# Patient Record
Sex: Male | Born: 1970
Health system: Southern US, Community
[De-identification: ages and names within clinical notes are randomized; demographics above are authoritative.]

## PROBLEM LIST (undated history)

## (undated) DIAGNOSIS — B9562 Methicillin resistant Staphylococcus aureus infection as the cause of diseases classified elsewhere: Secondary | ICD-10-CM

## (undated) DIAGNOSIS — Z87442 Personal history of urinary calculi: Secondary | ICD-10-CM

## (undated) DIAGNOSIS — L723 Sebaceous cyst: Principal | ICD-10-CM

## (undated) DIAGNOSIS — L0390 Cellulitis, unspecified: Secondary | ICD-10-CM

## (undated) DIAGNOSIS — K429 Umbilical hernia without obstruction or gangrene: Secondary | ICD-10-CM

## (undated) DIAGNOSIS — M1990 Unspecified osteoarthritis, unspecified site: Secondary | ICD-10-CM

## (undated) HISTORY — DX: Sebaceous cyst: L72.3

## (undated) HISTORY — DX: Methicillin resistant Staphylococcus aureus infection as the cause of diseases classified elsewhere: B95.62

## (undated) HISTORY — DX: Cellulitis, unspecified: L03.90

## (undated) HISTORY — PX: INCISION AND DRAINAGE ABSCESS / HEMATOMA OF BURSA / KNEE / THIGH: SUR668

## (undated) HISTORY — DX: Umbilical hernia without obstruction or gangrene: K42.9

---
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
## 1998-10-31 ENCOUNTER — Emergency Department (HOSPITAL_COMMUNITY): Admission: EM | Admit: 1998-10-31 | Discharge: 1998-10-31 | Payer: Self-pay

## 2002-11-29 ENCOUNTER — Emergency Department (HOSPITAL_COMMUNITY): Admission: EM | Admit: 2002-11-29 | Discharge: 2002-11-29 | Payer: Self-pay | Admitting: Emergency Medicine

## 2002-12-03 ENCOUNTER — Ambulatory Visit (HOSPITAL_COMMUNITY): Admission: RE | Admit: 2002-12-03 | Discharge: 2002-12-03 | Payer: Self-pay

## 2003-06-01 ENCOUNTER — Encounter: Admission: RE | Admit: 2003-06-01 | Discharge: 2003-06-01 | Payer: Self-pay | Admitting: Internal Medicine

## 2011-04-28 ENCOUNTER — Emergency Department (HOSPITAL_COMMUNITY): Payer: BC Managed Care – PPO

## 2011-04-28 ENCOUNTER — Emergency Department (HOSPITAL_COMMUNITY)
Admission: EM | Admit: 2011-04-28 | Discharge: 2011-04-29 | Disposition: A | Discharge status: Home / Self Care | Payer: BC Managed Care – PPO | Attending: Emergency Medicine | Admitting: Emergency Medicine

## 2011-04-28 DIAGNOSIS — M25569 Pain in unspecified knee: Secondary | ICD-10-CM | POA: Insufficient documentation

## 2011-04-28 DIAGNOSIS — M76899 Other specified enthesopathies of unspecified lower limb, excluding foot: Secondary | ICD-10-CM | POA: Insufficient documentation

## 2011-04-28 DIAGNOSIS — M25469 Effusion, unspecified knee: Secondary | ICD-10-CM | POA: Insufficient documentation

## 2011-04-28 DIAGNOSIS — L02419 Cutaneous abscess of limb, unspecified: Secondary | ICD-10-CM | POA: Insufficient documentation

## 2013-05-08 ENCOUNTER — Ambulatory Visit (INDEPENDENT_AMBULATORY_CARE_PROVIDER_SITE_OTHER): Payer: BC Managed Care – PPO | Admitting: Surgery

## 2013-05-12 ENCOUNTER — Encounter (INDEPENDENT_AMBULATORY_CARE_PROVIDER_SITE_OTHER): Payer: Self-pay | Admitting: Surgery

## 2013-05-12 ENCOUNTER — Encounter (INDEPENDENT_AMBULATORY_CARE_PROVIDER_SITE_OTHER): Payer: Self-pay

## 2013-05-12 ENCOUNTER — Ambulatory Visit (INDEPENDENT_AMBULATORY_CARE_PROVIDER_SITE_OTHER): Payer: BC Managed Care – PPO | Admitting: Surgery

## 2013-05-12 VITALS — BP 118/78 | HR 66 | Temp 97.6°F | Resp 14 | Ht 73.0 in | Wt 261.4 lb

## 2013-05-12 DIAGNOSIS — D1739 Benign lipomatous neoplasm of skin and subcutaneous tissue of other sites: Secondary | ICD-10-CM

## 2013-05-12 DIAGNOSIS — K429 Umbilical hernia without obstruction or gangrene: Secondary | ICD-10-CM | POA: Insufficient documentation

## 2013-05-12 DIAGNOSIS — L723 Sebaceous cyst: Secondary | ICD-10-CM | POA: Insufficient documentation

## 2013-05-12 HISTORY — DX: Sebaceous cyst: L72.3

## 2013-05-12 HISTORY — DX: Umbilical hernia without obstruction or gangrene: K42.9

## 2013-05-12 MED ORDER — SULFAMETHOXAZOLE-TMP DS 800-160 MG PO TABS: 1.0000 | ORAL_TABLET | Freq: Two times a day (BID) | ORAL | Status: DC

## 2013-05-12 NOTE — Progress Notes (Addendum)
Subjective:     Patient ID: Timothy Newman, male   DOB: 09/15/1970, 42 y.o.   MRN: [id number]  HPI  Timothy Newman  06-Mar-1971 [id number]  Patient Care Team: Pearson Grippe, MD as PCP - General (Internal Medicine)  This patient is a 42 y.o.male who presents today for surgical evaluation at the request of Dr. Selena Batten .   Reason for visit: Cyst on back  Was an active male.  Otherwise healthy.  Has not smoked in decades.  Has noticed a painful lump on his back.  It is intermittently swollen and inflamed.  This all started about 6 months ago.  His primary care physician has been following up.  He has been given oral antibiotics to help calm down.  Because of repeated episodes of swelling and pain, he wished to have it removed.  Therefore, he was sent to surgery for evaluation.  He recalls an episode about 10 years ago where he had abscesses drained on his inner and outer right thigh.  He was on IV vancomycin with a PICC line.  Treated in the ER for this.  Recalls being told he had MRSA.  No other skin infections.    Patient Active Problem List   Diagnosis Date Noted  . Inflamed sebaceous cyst 05/12/2013    Past Medical History  Diagnosis Date  . MRSA cellulitis ?2004    I&D in ED.  IV Vanco w PICC    Past Surgical History  Procedure Laterality Date  . Incision and drainage abscess / hematoma of bursa / knee / thigh  ?2004    ?Dr Lucas Mallow     History   Social History  . Marital Status: Married    Spouse Name: N/A    Number of Children: N/A  . Years of Education: N/A   Occupational History  . Not on file.   Social History Main Topics  . Smoking status: Former Games developer  . Smokeless tobacco: Former Neurosurgeon  . Alcohol Use: No  . Drug Use: No  . Sexual Activity: Not on file   Other Topics Concern  . Not on file   Social History Narrative  . No narrative on file    History reviewed. No pertinent family history.  No current outpatient prescriptions on file.   No current  facility-administered medications for this visit.     No Known Allergies  BP 118/78  Pulse 66  Temp(Src) 97.6 F (36.4 C) (Temporal)  Resp 14  Ht 6\' 1"  (1.854 m)  Wt 261 lb 6.4 oz (118.57 kg)  BMI 34.49 kg/m2  No results found.   Review of Systems  Constitutional: Negative for fever, chills and diaphoresis.  HENT: Negative for ear discharge, facial swelling, mouth sores, nosebleeds, sore throat and trouble swallowing.   Eyes: Negative for photophobia, discharge and visual disturbance.  Respiratory: Negative for choking, chest tightness, shortness of breath and stridor.   Cardiovascular: Negative for chest pain and palpitations.  Gastrointestinal: Negative for nausea, vomiting, abdominal pain, diarrhea, constipation, blood in stool, abdominal distention, anal bleeding and rectal pain.  Endocrine: Negative for cold intolerance and heat intolerance.  Genitourinary: Negative for dysuria, urgency, difficulty urinating and testicular pain.  Musculoskeletal: Positive for back pain. Negative for arthralgias, gait problem and myalgias.  Skin: Positive for rash. Negative for color change, pallor and wound.  Allergic/Immunologic: Negative for environmental allergies and food allergies.  Neurological: Negative for dizziness, speech difficulty, weakness, numbness and headaches.  Hematological: Negative for adenopathy. Does not bruise/bleed  easily.  Psychiatric/Behavioral: Negative for hallucinations, confusion and agitation.       Objective:   Physical Exam  Constitutional: He is oriented to person, place, and time. He appears well-developed and well-nourished. No distress.  HENT:  Head: Normocephalic.  Mouth/Throat: Oropharynx is clear and moist. No oropharyngeal exudate.  Eyes: Conjunctivae and EOM are normal. Pupils are equal, round, and reactive to light. No scleral icterus.  Neck: Normal range of motion. Neck supple. No tracheal deviation present.  Cardiovascular: Normal rate,  regular rhythm and intact distal pulses.   Pulmonary/Chest: Effort normal and breath sounds normal. No respiratory distress.  Abdominal: Soft. He exhibits no distension. There is no tenderness. A hernia is present. Hernia confirmed negative in the right inguinal area and confirmed negative in the left inguinal area.    Musculoskeletal: Normal range of motion. He exhibits no tenderness.       Back:  Lymphadenopathy:    He has no cervical adenopathy.       Right: No inguinal adenopathy present.       Left: No inguinal adenopathy present.  Neurological: He is alert and oriented to person, place, and time. No cranial nerve deficit. He exhibits normal muscle tone. Coordination normal.  Skin: Skin is warm and dry. No rash noted. He is not diaphoretic. No erythema. No pallor.  Psychiatric: He has a normal mood and affect. His behavior is normal. Judgment and thought content normal.       Assessment:     Inflamed sebaceous cyst with recurrent infections.     Plan:     I recommended removal.  It is not seem actively infected now.  We had time right now, so we took care of it today:  The pathophysiology of subcutaneous masses and differential diagnosis was discussed.  Natural history progression was discussed.  The patient's symptoms are not adequately controlled.  The mass has not resolved.  Non-operative treatment has not healed the mass.  Therefore, I recommended excision of the mass. Technique, risks, benefits, alternatives discussed.  The patient expressed understanding & wished to proceed.  I placed a field block with local anaesthetic.  I incised the skin 2 cm transversely over the mass.  I sharply entered through the dermal layer into the subcutaneous tissues with a scalpel.  I was able to circumferentially come around the 2x2cm cystic mass and remove it.  Because the mass was obviously benign cyst lipoma, I did not send the mass for pathological evaluation. I assured hemostasis.    Because there is no obvious infection, I decided to close the wound.  I closed it with deep dermal interrupted absorbable 3-0 vicryl suture.  I closed skin with running subcuticular suture.  Steri-Strips are placed.  Sterile gauze and dressing applied.  The patient tolerated the procedure.   I discussed postop care with the patient in detail.  Instructions are written and given to them on discharge.  I gave a prescription of Bactrim and hold using it unless things seem to be worsening.  If he was still struggling, call us sooner.  We will have the patient return to clinic for close follow up to make sure the incision heals.  His wife works at the dialysis center, so he is confident that he is somewhat that will keep an eye on it with him.  He has a small umbilical hernia, but it is not symptomatic.  He has never noticed anything there.  I do not strongly recommend repairing it at this time  unless something changes

## 2013-05-12 NOTE — Patient Instructions (Addendum)
GENERAL SURGERY: POST OP INSTRUCTIONS  1. DIET: Follow a light bland diet the first 24 hours after arrival home, such as soup, liquids, crackers, etc.  Be sure to include lots of fluids daily.  Avoid fast food or heavy meals as your are more likely to get nauseated.   2. Take your usually prescribed home medications unless otherwise directed. 3. PAIN CONTROL: a. Pain is best controlled by a usual combination of three different methods TOGETHER: i. Ice/Heat ii. Over the counter pain medication iii. Prescription pain medication b. Most patients will experience some swelling and bruising around the incisions.  Ice packs or heating pads (30-60 minutes up to 6 times a day) will help. Use ice for the first few days to help decrease swelling and bruising, then switch to heat to help relax tight/sore spots and speed recovery.  Some people prefer to use ice alone, heat alone, alternating between ice & heat.  Experiment to what works for you.  Swelling and bruising can take several weeks to resolve.   c. It is helpful to take an over-the-counter pain medication regularly for the first few weeks.  Choose one of the following that works best for you: i. Naproxen (Aleve, etc)  Two 220mg  tabs twice a day ii. Ibuprofen (Advil, etc) Three 200mg  tabs four times a day (every meal & bedtime) iii. Acetaminophen (Tylenol, etc) 500-650mg  four times a day (every meal & bedtime)  4. Avoid getting constipated.  Between the surgery and the pain medications, it is common to experience some constipation.  Increasing fluid intake and taking a fiber supplement (such as Metamucil, Citrucel, FiberCon, MiraLax, etc) 1-2 times a day regularly will usually help prevent this problem from occurring.  A mild laxative (prune juice, Milk of Magnesia, MiraLax, etc) should be taken according to package directions if there are no bowel movements after 48 hours.   5. Wash / shower every day.  You may shower over the dressings as they are  waterproof.  Continue to shower over incision(s) after the dressing is off. 6. Remove your bandage day after surgery.  You may leave the incision open to air.  You have skin tapes (Steri Strips) covering the incision(s).  Leave them on until one week, then remove.  You may replace a dressing/Band-Aid to cover the incision for comfort if you wish. If the incision is getting more inflamed, start prescription antibiotic Bactrim (sulfa/TMP) & call us if not better     7. ACTIVITIES as tolerated:   a. You may resume regular (light) daily activities beginning the next day-such as daily self-care, walking, climbing stairs-gradually increasing activities as tolerated.  If you can walk 30 minutes without difficulty, it is safe to try more intense activity such as jogging, treadmill, bicycling, low-impact aerobics, swimming, etc. b. Save the most intensive and strenuous activity for last such as sit-ups, heavy lifting, contact sports, etc  Refrain from any heavy lifting or straining until you are off narcotics for pain control.   c. DO NOT PUSH THROUGH PAIN.  Let pain be your guide: If it hurts to do something, don't do it.  Pain is your body warning you to avoid that activity for another week until the pain goes down. d. You may drive when you are no longer taking prescription pain medication, you can comfortably wear a seatbelt, and you can safely maneuver your car and apply brakes. e. Bonita Quin may have sexual intercourse when it is comfortable.  8. FOLLOW UP in our office a.  Please call [facility] at [phone] to set up an appointment to see your surgeon in the office for a follow-up appointment approximately 2-3 weeks after your surgery. b. Make sure that you call for this appointment the day you arrive home to insure a convenient appointment time. 9. IF YOU HAVE DISABILITY OR FAMILY LEAVE FORMS, BRING THEM TO THE OFFICE FOR PROCESSING.  DO NOT GIVE THEM TO YOUR DOCTOR.   WHEN TO CALL us [phone]  [phone]: 1. Poor pain control 2. Reactions / problems with new medications (rash/itching, nausea, etc)  3. Fever over 101.5 F (38.5 C) 4. Worsening swelling or bruising 5. Continued bleeding from incision. 6. Increased pain, redness, or drainage from the incision 7. Difficulty breathing / swallowing   The clinic staff is available to answer your questions during regular business hours (8:30am-5pm).  Please don't hesitate to call and ask to speak to one of our nurses for clinical concerns.   If you have a medical emergency, go to the nearest emergency room or call 911.  A surgeon from [facility] is always on call at the [facility], [place] [address], [place], [place]  [address] ? MAIN: [phone] ? TOLL FREE: [phone] ?  FAX [phone] [email or web address]

## 2013-05-14 ENCOUNTER — Encounter (INDEPENDENT_AMBULATORY_CARE_PROVIDER_SITE_OTHER): Payer: Self-pay

## 2013-06-09 ENCOUNTER — Ambulatory Visit (INDEPENDENT_AMBULATORY_CARE_PROVIDER_SITE_OTHER): Payer: BC Managed Care – PPO | Admitting: Surgery

## 2013-06-09 ENCOUNTER — Encounter (INDEPENDENT_AMBULATORY_CARE_PROVIDER_SITE_OTHER): Payer: Self-pay | Admitting: Surgery

## 2013-06-09 VITALS — BP 138/88 | HR 80 | Temp 98.2°F | Resp 14 | Ht 73.0 in | Wt 261.0 lb

## 2013-06-09 DIAGNOSIS — L723 Sebaceous cyst: Secondary | ICD-10-CM

## 2013-06-09 DIAGNOSIS — K429 Umbilical hernia without obstruction or gangrene: Secondary | ICD-10-CM

## 2013-06-09 NOTE — Patient Instructions (Signed)

## 2013-06-09 NOTE — Progress Notes (Signed)
Subjective:     Patient ID: Timothy Newman, male   DOB: [birth year], 42 y.o.   MRN: [id number]  HPI  Timothy Newman  08-Jul-1970 [id number]  Patient Care Team: Pearson Grippe, MD as PCP - General (Internal Medicine)  Procedure (Date: 05/12/2013):  Removal of cyst on the upper back.  This patient returns for surgical re-evaluation.  He feels well.  Her incision healed well.  No pain or drainage.  No fevers or chills.  No lesions.  No problems in his belly button.  Patient Active Problem List   Diagnosis Date Noted  . Umbilical hernia - 5mm 05/12/2013    Past Medical History  Diagnosis Date  . MRSA cellulitis ?2004    I&D in ED.  IV Vanco w PICC  . Umbilical hernia - 5mm 05/12/2013  . Inflamed sebaceous cyst s/p excision Nov 2014 05/12/2013    Past Surgical History  Procedure Laterality Date  . Incision and drainage abscess / hematoma of bursa / knee / thigh  ?2004    ?Dr Lucas Mallow     History   Social History  . Marital Status: Married    Spouse Name: N/A    Number of Children: N/A  . Years of Education: N/A   Occupational History  . Not on file.   Social History Main Topics  . Smoking status: Former Games developer  . Smokeless tobacco: Former Neurosurgeon  . Alcohol Use: No  . Drug Use: No  . Sexual Activity: Not on file   Other Topics Concern  . Not on file   Social History Narrative  . No narrative on file    History reviewed. No pertinent family history.  No current outpatient prescriptions on file.   No current facility-administered medications for this visit.     No Known Allergies  BP 138/88  Pulse 80  Temp(Src) 98.2 F (36.8 C) (Temporal)  Resp 14  Ht 6\' 1"  (1.854 m)  Wt 261 lb (118.389 kg)  BMI 34.44 kg/m2  No results found.   Review of Systems  Constitutional: Negative for fever, chills and diaphoresis.  HENT: Negative for sore throat and trouble swallowing.   Eyes: Negative for photophobia and visual disturbance.  Respiratory: Negative for choking  and shortness of breath.   Cardiovascular: Negative for chest pain and palpitations.  Gastrointestinal: Negative for nausea, vomiting, abdominal distention, anal bleeding and rectal pain.  Genitourinary: Negative for dysuria, urgency, difficulty urinating and testicular pain.  Musculoskeletal: Negative for arthralgias, gait problem, myalgias and neck pain.  Skin: Negative for color change and rash.  Neurological: Negative for dizziness, speech difficulty, weakness and numbness.  Hematological: Negative for adenopathy.  Psychiatric/Behavioral: Negative for hallucinations, confusion and agitation.       Objective:   Physical Exam  Constitutional: He is oriented to person, place, and time. He appears well-developed and well-nourished. No distress.  HENT:  Head: Normocephalic.  Mouth/Throat: Oropharynx is clear and moist. No oropharyngeal exudate.  Eyes: Conjunctivae and EOM are normal. Pupils are equal, round, and reactive to light. No scleral icterus.  Neck: Normal range of motion. No tracheal deviation present.  Cardiovascular: Normal rate, normal heart sounds and intact distal pulses.   Pulmonary/Chest: Effort normal. No respiratory distress.  Abdominal: Soft. He exhibits no distension. There is no tenderness. Hernia confirmed negative in the right inguinal area and confirmed negative in the left inguinal area.  Musculoskeletal: Normal range of motion. He exhibits no tenderness.  Upper lumbar Incision clean with normal healing ridge  Neurological: He is alert and oriented to person, place, and time. No cranial nerve deficit. He exhibits normal muscle tone. Coordination normal.  Skin: Skin is warm and dry. No rash noted. He is not diaphoretic.  Psychiatric: He has a normal mood and affect. His behavior is normal.       Assessment:     Healing well s/p excision of seb cyst on back     Plan:     Increase activity as tolerated to regular activity.  Low impact exercise such as walking  an hour a day at least ideal.  Do not push through pain.  He has a small umbilical hernia, but it is not symptomatic. He has never noticed anything there. I do not strongly recommend repairing it at this time unless something changes  Return to clinic as needed.   Instructions discussed.  Followup with primary care physician for other health issues as would normally be done.  Questions answered.  The patient expressed understanding and appreciation

## 2018-05-13 NOTE — Progress Notes (Signed)
Complete Physical  Assessment and Plan:  Timothy Newman was seen today for annual exam.  Diagnoses and all orders for this visit:  Encounter for medical examination to establish care  Encounter for routine adult health examination without abnormal findings  Obesity (BMI 30.0-34.9) Long discussion about weight loss, diet, and exercise Recommended diet heavy in fruits and veggies and low in animal meats, cheeses, and dairy products, appropriate calorie intake Patient working on cutting down on sodas, working out Discussed appropriate weight for height  and initial goal (220lb) Follow up at next visit  Medication management -     CBC with Differential/Platelet -     COMPLETE METABOLIC PANEL WITH GFR -     Urinalysis w microscopic + reflex cultur -     Magnesium  Screening for cardiovascular condition -     EKG 12-Lead  Screening cholesterol level -     Lipid panel  Screening for thyroid disorder -     TSH  Screening for diabetes mellitus -     Hemoglobin A1c  Vitamin D deficiency -     VITAMIN D 25 Hydroxy (Vit-D Deficiency, Fractures)    Discussed med's effects and SE's. Screening labs and tests as requested with regular follow-up as recommended. Over 40 minutes of exam, counseling, chart review and critical decision making was performed  Future Appointments  Date Time Provider [place]  05/19/2019  3:00 PM Liane Comber, NP [facility] None     HPI Patient presents to establish care and for a complete physical. He has Umbilical hernia - 82mm and Obesity (BMI 30.0-34.9) on their problem list. He was previously seeing Dr. Conley Canal upstairs, moving care here as recommended by his wife.   He works in Building control surveyor, very active at work.  Goes to the gym 2 days a week as well.   BMI is Body mass index is 33.03 kg/m., he has been working on diet and exercise, down from 8 months ago, down from 267lb. Quit sodas. He drinks 2-3 cups of coffee daily. Drinks  ~1 gallon water daily. Sleeps 7 hours most days. Gym 2 days a week. He does intermittent fasting, skips breakfast. Typically has protein + veggies for dinner, lunch varies. Eats 2-3 servings of red meet weekly.  Wt Readings from Last 3 Encounters:  05/14/18 240 lb 3.2 oz (109 kg)  06/09/13 261 lb (118.4 kg)  05/12/13 261 lb 6.4 oz (118.6 kg)   His blood pressure has been controlled at home, today their BP is BP: 120/80 He does workout. He denies chest pain, shortness of breath, dizziness.    Current Medications:  No current outpatient medications on file prior to visit.   No current facility-administered medications on file prior to visit.    Allergies:  No Known Allergies Health Maintenance:   There is no immunization history on file for this patient.  Tetanus: 2016 Pneumovax: -  Flu vaccine: declines  Colonoscopy: -  EGD: -  Eye Exam: Dr. Truman Hayward at [facility] on [address], last visit 2018, wears glasses Dentist: Dr. Radford Pax & [facility], last visit 2018,   Patient Care Team: Unk Pinto, MD as PCP - General (Internal Medicine)  Medical History:  has Umbilical hernia - 76mm and Obesity (BMI 30.0-34.9) on their problem list. Surgical History:  He  has a past surgical history that includes Incision and drainage abscess / hematoma of bursa / knee / thigh (Right, ?2004). Family History:  His family history includes AAA (abdominal aortic aneurysm) in his  maternal grandmother; Asthma in his mother; Cancer in his paternal grandmother; Fibromyalgia in his mother; Heart failure (age of onset: 49) in his paternal grandfather; Hyperlipidemia in his mother; Lung cancer in his maternal grandfather; Melanoma in his father. Social History:   reports that he quit smoking about 26 years ago. His smoking use included cigarettes. He has a 3.00 pack-year smoking history. He quit smokeless tobacco use about 3 years ago.  His smokeless tobacco use included snuff. He reports that he does not drink alcohol  or use drugs. Review of Systems:  Review of Systems  Constitutional: Negative for malaise/fatigue and weight loss.  HENT: Negative for hearing loss and tinnitus.   Eyes: Negative for blurred vision and double vision.  Respiratory: Negative for cough, sputum production, shortness of breath and wheezing.   Cardiovascular: Negative for chest pain, palpitations, orthopnea, claudication, leg swelling and PND.  Gastrointestinal: Negative for abdominal pain, blood in stool, constipation, diarrhea, heartburn, melena, nausea and vomiting.  Genitourinary: Negative.   Musculoskeletal: Negative for falls, joint pain and myalgias.  Skin: Negative for rash.  Neurological: Negative for dizziness, tingling, sensory change, weakness and headaches.  Endo/Heme/Allergies: Negative for polydipsia.  Psychiatric/Behavioral: Negative.  Negative for depression, memory loss, substance abuse and suicidal ideas. The patient is not nervous/anxious and does not have insomnia.   All other systems reviewed and are negative.   Physical Exam: Estimated body mass index is 33.03 kg/m as calculated from the following:   Height as of this encounter: 5' 11.5" (1.816 m).   Weight as of this encounter: 240 lb 3.2 oz (109 kg). BP 120/80   Pulse 73   Temp (!) 97.3 F (36.3 C)   Ht 5' 11.5" (1.816 m)   Wt 240 lb 3.2 oz (109 kg)   SpO2 98%   BMI 33.03 kg/m  General Appearance: Well nourished, in no apparent distress.  Eyes: PERRLA, EOMs, conjunctiva no swelling or erythema, normal fundi and vessels.  Sinuses: No Frontal/maxillary tenderness  ENT/Mouth: Ext aud canals clear, normal light reflex with TMs without erythema, bulging. Good dentition. No erythema, swelling, or exudate on post pharynx. Tonsils not swollen or erythematous. Hearing normal.  Neck: Supple, thyroid normal. No bruits  Respiratory: Respiratory effort normal, BS equal bilaterally without rales, rhonchi, wheezing or stridor.  Cardio: RRR without murmurs,  rubs or gallops. Brisk peripheral pulses without edema.  Chest: symmetric, with normal excursions and percussion.  Abdomen: Soft, nontender, no guarding, rebound, hernias, masses, or organomegaly.  Lymphatics: Non tender without lymphadenopathy.  Genitourinary: Defer Musculoskeletal: Full ROM all peripheral extremities,5/5 strength, and normal gait.  Skin: Warm, dry without rashes, lesions, ecchymosis. Neuro: Cranial nerves intact, reflexes equal bilaterally. Normal muscle tone, no cerebellar symptoms. Sensation intact.  Psych: Awake and oriented X 3, normal affect, Insight and Judgment appropriate.   EKG: WNL, sinus rhythm with PVCs  Gorden Harms Mariona Scholes 3:25 PM [facility]

## 2018-05-14 ENCOUNTER — Ambulatory Visit: Payer: 59 | Admitting: Adult Health

## 2018-05-14 ENCOUNTER — Encounter: Payer: Self-pay | Admitting: Adult Health

## 2018-05-14 VITALS — BP 120/80 | HR 73 | Temp 97.3°F | Ht 71.5 in | Wt 240.2 lb

## 2018-05-14 DIAGNOSIS — E66811 Obesity, class 1: Secondary | ICD-10-CM

## 2018-05-14 DIAGNOSIS — Z79899 Other long term (current) drug therapy: Secondary | ICD-10-CM

## 2018-05-14 DIAGNOSIS — Z136 Encounter for screening for cardiovascular disorders: Secondary | ICD-10-CM | POA: Diagnosis not present

## 2018-05-14 DIAGNOSIS — I10 Essential (primary) hypertension: Secondary | ICD-10-CM | POA: Diagnosis not present

## 2018-05-14 DIAGNOSIS — E559 Vitamin D deficiency, unspecified: Secondary | ICD-10-CM

## 2018-05-14 DIAGNOSIS — Z131 Encounter for screening for diabetes mellitus: Secondary | ICD-10-CM

## 2018-05-14 DIAGNOSIS — Z13220 Encounter for screening for lipoid disorders: Secondary | ICD-10-CM

## 2018-05-14 DIAGNOSIS — Z1329 Encounter for screening for other suspected endocrine disorder: Secondary | ICD-10-CM

## 2018-05-14 DIAGNOSIS — Z0000 Encounter for general adult medical examination without abnormal findings: Secondary | ICD-10-CM | POA: Diagnosis not present

## 2018-05-14 DIAGNOSIS — E669 Obesity, unspecified: Secondary | ICD-10-CM

## 2018-05-14 NOTE — Patient Instructions (Addendum)
  [name] , Thank you for taking time to come for your Annual Wellness Visit. I appreciate your ongoing commitment to your health goals. Please review the following plan we discussed and let me know if I can assist you in the future.   These are the goals we discussed: Goals    . DIET - EAT MORE FRUITS AND VEGETABLES     7+ servings (1/2 cup) daily, limit red meat intake to 6 oz/week    . Weight (lb) < 220 lb (99.8 kg)       This is a list of the screening recommended for you and due dates:  Health Maintenance  Topic Date Due  . Flu Shot  05/15/2019*  . Tetanus Vaccine  06/26/2024  . HIV Screening  Completed  *Topic was postponed. The date shown is not the original due date.    Know what a healthy weight is for you (roughly BMI <25) and aim to maintain this  Aim for 7+ servings of fruits and vegetables daily  65-80+ fluid ounces of water or unsweet tea for healthy kidneys  Limit to max 1 drink of alcohol per day; avoid smoking/tobacco  Limit animal fats in diet for cholesterol and heart health - choose grass fed whenever available  Avoid highly processed foods, and foods high in saturated/trans fats  Aim for low stress - take time to unwind and care for your mental health  Aim for 150 min of moderate intensity exercise weekly for heart health, and weights twice weekly for bone health  Aim for 7-9 hours of sleep daily       When it comes to diets, agreement about the perfect plan isn't easy to find, even among the experts. Experts at the [employer] developed an idea known as the Healthy Eating Plate. Just imagine a plate divided into logical, healthy portions.  The emphasis is on diet quality:  Load up on vegetables and fruits - one-half of your plate: Aim for color and variety, and remember that potatoes don't count.  Go for whole grains - one-quarter of your plate: Whole wheat, barley, wheat berries, quinoa, oats, brown rice, and foods made  with them. If you want pasta, go with whole wheat pasta.  Protein power - one-quarter of your plate: Fish, chicken, beans, and nuts are all healthy, versatile protein sources. Limit red meat.  The diet, however, does go beyond the plate, offering a few other suggestions.  Use healthy plant oils, such as olive, canola, soy, corn, sunflower and peanut. Check the labels, and avoid partially hydrogenated oil, which have unhealthy trans fats.  If you're thirsty, drink water. Coffee and tea are good in moderation, but skip sugary drinks and limit milk and dairy products to one or two daily servings.  The type of carbohydrate in the diet is more important than the amount. Some sources of carbohydrates, such as vegetables, fruits, whole grains, and beans-are healthier than others.  Finally, stay active.

## 2018-05-15 ENCOUNTER — Other Ambulatory Visit: Payer: Self-pay | Admitting: Adult Health

## 2018-05-15 LAB — COMPLETE METABOLIC PANEL WITH GFR
AG Ratio: 1.8 (calc) (ref 1.0–2.5)
ALT: 18 U/L (ref 9–46)
AST: 16 U/L (ref 10–40)
Albumin: 4.6 g/dL (ref 3.6–5.1)
Alkaline phosphatase (APISO): 45 U/L (ref 40–115)
BUN: 22 mg/dL (ref 7–25)
CO2: 29 mmol/L (ref 20–32)
Calcium: 9.7 mg/dL (ref 8.6–10.3)
Chloride: 103 mmol/L (ref 98–110)
Creat: 1.26 mg/dL (ref 0.60–1.35)
GFR, Est African American: 78 mL/min/{1.73_m2} (ref >=60)
GFR, Est Non African American: 67 mL/min/{1.73_m2} (ref >=60)
Globulin: 2.5 g/dL (calc) (ref 1.9–3.7)
Glucose, Bld: 71 mg/dL (ref 65–99)
Potassium: 4.5 mmol/L (ref 3.5–5.3)
Sodium: 139 mmol/L (ref 135–146)
Total Bilirubin: 1.1 mg/dL (ref 0.2–1.2)
Total Protein: 7.1 g/dL (ref 6.1–8.1)

## 2018-05-15 LAB — CBC WITH DIFFERENTIAL/PLATELET
Basophils Absolute: 77 cells/uL (ref 0–200)
Basophils Relative: 0.9 %
Eosinophils Absolute: 138 cells/uL (ref 15–500)
Eosinophils Relative: 1.6 %
HCT: 44.1 % (ref 38.5–50.0)
Hemoglobin: 15.4 g/dL (ref 13.2–17.1)
Lymphs Abs: 2227 cells/uL (ref 850–3900)
MCH: 32.1 pg (ref 27.0–33.0)
MCHC: 34.9 g/dL (ref 32.0–36.0)
MCV: 91.9 fL (ref 80.0–100.0)
MPV: 10.2 fL (ref 7.5–12.5)
Monocytes Relative: 10.7 %
Neutro Abs: 5237 cells/uL (ref 1500–7800)
Neutrophils Relative %: 60.9 %
Platelets: 284 10*3/uL (ref 140–400)
RBC: 4.8 10*6/uL (ref 4.20–5.80)
RDW: 11.9 % (ref 11.0–15.0)
Total Lymphocyte: 25.9 %
WBC mixed population: 920 cells/uL (ref 200–950)
WBC: 8.6 10*3/uL (ref 3.8–10.8)

## 2018-05-15 LAB — URINALYSIS W MICROSCOPIC + REFLEX CULTURE
Bacteria, UA: NONE SEEN /HPF
Bilirubin Urine: NEGATIVE
Glucose, UA: NEGATIVE
Hgb urine dipstick: NEGATIVE
Hyaline Cast: NONE SEEN /LPF
Ketones, ur: NEGATIVE
Leukocyte Esterase: NEGATIVE
Nitrites, Initial: NEGATIVE
Protein, ur: NEGATIVE
Specific Gravity, Urine: 1.025 (ref 1.001–1.03)
Squamous Epithelial / HPF: NONE SEEN /HPF (ref <=5)
WBC, UA: NONE SEEN /HPF (ref 0–5)
pH: 6 (ref 5.0–8.0)

## 2018-05-15 LAB — LIPID PANEL
Cholesterol: 191 mg/dL (ref <200)
HDL: 49 mg/dL (ref >40)
LDL Cholesterol (Calc): 124 mg/dL (calc) — ABNORMAL HIGH
Non-HDL Cholesterol (Calc): 142 mg/dL (calc) — ABNORMAL HIGH (ref <130)
Total CHOL/HDL Ratio: 3.9 (calc) (ref <5.0)
Triglycerides: 85 mg/dL (ref <150)

## 2018-05-15 LAB — HEMOGLOBIN A1C
Hgb A1c MFr Bld: 5.1 % of total Hgb (ref <5.7)
Mean Plasma Glucose: 100 (calc)
eAG (mmol/L): 5.5 (calc)

## 2018-05-15 LAB — TSH: TSH: 1.01 mIU/L (ref 0.40–4.50)

## 2018-05-15 LAB — VITAMIN D 25 HYDROXY (VIT D DEFICIENCY, FRACTURES): Vit D, 25-Hydroxy: 39 ng/mL (ref 30–100)

## 2018-05-15 LAB — NO CULTURE INDICATED

## 2018-05-15 LAB — MAGNESIUM: Magnesium: 2.1 mg/dL (ref 1.5–2.5)

## 2019-03-03 ENCOUNTER — Encounter (HOSPITAL_COMMUNITY): Payer: Self-pay

## 2019-03-03 ENCOUNTER — Emergency Department (HOSPITAL_COMMUNITY)
Admission: EM | Admit: 2019-03-03 | Discharge: 2019-03-03 | Disposition: A | Discharge status: Home / Self Care | Payer: 59 | Attending: Emergency Medicine | Admitting: Emergency Medicine

## 2019-03-03 ENCOUNTER — Other Ambulatory Visit: Payer: Self-pay

## 2019-03-03 ENCOUNTER — Emergency Department (HOSPITAL_COMMUNITY): Payer: 59

## 2019-03-03 DIAGNOSIS — R310 Gross hematuria: Secondary | ICD-10-CM

## 2019-03-03 DIAGNOSIS — R109 Unspecified abdominal pain: Secondary | ICD-10-CM | POA: Diagnosis present

## 2019-03-03 DIAGNOSIS — Z87891 Personal history of nicotine dependence: Secondary | ICD-10-CM | POA: Diagnosis not present

## 2019-03-03 DIAGNOSIS — N201 Calculus of ureter: Secondary | ICD-10-CM | POA: Insufficient documentation

## 2019-03-03 LAB — CBC
HCT: 45.6 % (ref 39.0–52.0)
Hemoglobin: 15.8 g/dL (ref 13.0–17.0)
MCH: 32.4 pg (ref 26.0–34.0)
MCHC: 34.6 g/dL (ref 30.0–36.0)
MCV: 93.4 fL (ref 80.0–100.0)
Platelets: 248 10*3/uL (ref 150–400)
RBC: 4.88 MIL/uL (ref 4.22–5.81)
RDW: 11.9 % (ref 11.5–15.5)
WBC: 9.9 10*3/uL (ref 4.0–10.5)
nRBC: 0 % (ref 0.0–0.2)

## 2019-03-03 LAB — BASIC METABOLIC PANEL
Anion gap: 8 (ref 5–15)
BUN: 19 mg/dL (ref 6–20)
CO2: 25 mmol/L (ref 22–32)
Calcium: 9.4 mg/dL (ref 8.9–10.3)
Chloride: 104 mmol/L (ref 98–111)
Creatinine, Ser: 1.16 mg/dL (ref 0.61–1.24)
GFR calc Af Amer: >60 mL/min (ref >60)
GFR calc non Af Amer: >60 mL/min (ref >60)
Glucose, Bld: 116 mg/dL — ABNORMAL HIGH (ref 70–99)
Potassium: 4.7 mmol/L (ref 3.5–5.1)
Sodium: 137 mmol/L (ref 135–145)

## 2019-03-03 LAB — URINALYSIS, ROUTINE W REFLEX MICROSCOPIC

## 2019-03-03 LAB — URINALYSIS, MICROSCOPIC (REFLEX): RBC / HPF: >50 RBC/hpf (ref 0–5)

## 2019-03-03 MED ORDER — TAMSULOSIN HCL 0.4 MG PO CAPS: 0.4000 mg | ORAL_CAPSULE | Freq: Every day | ORAL | 0 refills | Status: DC

## 2019-03-03 MED ORDER — OXYCODONE-ACETAMINOPHEN 5-325 MG PO TABS: 1.0000 | ORAL_TABLET | Freq: Four times a day (QID) | ORAL | 0 refills | Status: DC | PRN

## 2019-03-03 NOTE — ED Triage Notes (Signed)
Patient complains of left flank radiating to groin with hematuria and vomiting since am. Denies trauma, no hx of stone

## 2019-03-03 NOTE — ED Provider Notes (Signed)
[facility] EMERGENCY DEPARTMENT Provider Note   CSN: [id number] Arrival date & time: 03/03/19  [id number]     History   Chief Complaint Chief Complaint  Patient presents with   flank pain/ hematuria    HPI Timothy Newman is a 48 y.o. male.     Patient with acute onset of left flank/back pain starting last night.  Pain has been waxing and waning.  This morning it was severe and caused him to vomit.  Patient is concerned that he has a kidney stone.  Pain is in the left flank.  Denies radiation to the groin to me in contrast to the nursing note.  No dysuria, increased frequency urgency, penile discharge.  Patient developed gross blood in the urine.  No previous history of kidney stones or other abdominal surgeries.  No fevers.  No chest pain or shortness of breath.  No blood thinners.     Past Medical History:  Diagnosis Date   Inflamed sebaceous cyst s/p excision Nov 2014 05/12/2013   MRSA cellulitis ?2004   I&D in ED.  IV Vanco w PICC   Umbilical hernia - 72mm [id number]    Patient Active Problem List   Diagnosis Date Noted   Obesity (BMI 30.0-34.9) 0000000   Umbilical hernia - 48mm [id number]    Past Surgical History:  Procedure Laterality Date   INCISION AND DRAINAGE ABSCESS / HEMATOMA OF BURSA / KNEE / THIGH Right ?2004   ?Dr Hassell Halim         Home Medications    Prior to Admission medications   Not on File    Family History Family History  Problem Relation Age of Onset   Asthma Mother    Fibromyalgia Mother    Hyperlipidemia Mother    Melanoma Father        with brain mets   AAA (abdominal aortic aneurysm) Maternal Grandmother    Lung cancer Maternal Grandfather    Cancer Paternal Grandmother    Heart failure Paternal Grandfather 90    Social History Social History   Tobacco Use   Smoking status: Former Smoker    Packs/day: 1.00    Years: 3.00    Pack years: 3.00    Types: Cigarettes    Quit date: 05/14/1992      Years since quitting: 26.8   Smokeless tobacco: Former Systems developer    Types: Snuff    Quit date: 05/15/2015  Substance Use Topics   Alcohol use: No   Drug use: No     Allergies   Patient has no known allergies.   Review of Systems Review of Systems  Constitutional: Negative for fever.  HENT: Negative for rhinorrhea and sore throat.   Eyes: Negative for redness.  Respiratory: Negative for cough.   Cardiovascular: Negative for chest pain.  Gastrointestinal: Positive for nausea and vomiting. Negative for abdominal pain and diarrhea.  Genitourinary: Positive for flank pain and hematuria. Negative for dysuria.  Musculoskeletal: Negative for myalgias.  Skin: Negative for rash.  Neurological: Negative for headaches.     Physical Exam Updated Vital Signs BP 109/72 (BP Location: Left Arm)    Pulse 75    Temp 98.1 F (36.7 C) (Oral)    Resp 20    SpO2 99%   Physical Exam Vitals signs and nursing note reviewed.  Constitutional:      Appearance: He is well-developed.  HENT:     Head: Normocephalic and atraumatic.  Eyes:     General:  Right eye: No discharge.        Left eye: No discharge.     Conjunctiva/sclera: Conjunctivae normal.  Neck:     Musculoskeletal: Normal range of motion and neck supple.  Cardiovascular:     Rate and Rhythm: Normal rate and regular rhythm.     Heart sounds: Normal heart sounds.  Pulmonary:     Effort: Pulmonary effort is normal.     Breath sounds: Normal breath sounds.  Abdominal:     Palpations: Abdomen is soft.     Tenderness: There is no abdominal tenderness. There is right CVA tenderness.  Skin:    General: Skin is warm and dry.  Neurological:     Mental Status: He is alert.      ED Treatments / Results  Labs (all labs ordered are listed, but only abnormal results are displayed) Labs Reviewed  URINALYSIS, ROUTINE W REFLEX MICROSCOPIC - Abnormal; Notable for the following components:      Result Value   Color, Urine RED  (*)    APPearance TURBID (*)    Glucose, UA   (*)    Value: TEST NOT REPORTED DUE TO COLOR INTERFERENCE OF URINE PIGMENT   Hgb urine dipstick   (*)    Value: TEST NOT REPORTED DUE TO COLOR INTERFERENCE OF URINE PIGMENT   Bilirubin Urine   (*)    Value: TEST NOT REPORTED DUE TO COLOR INTERFERENCE OF URINE PIGMENT   Ketones, ur   (*)    Value: TEST NOT REPORTED DUE TO COLOR INTERFERENCE OF URINE PIGMENT   Protein, ur   (*)    Value: TEST NOT REPORTED DUE TO COLOR INTERFERENCE OF URINE PIGMENT   Nitrite   (*)    Value: TEST NOT REPORTED DUE TO COLOR INTERFERENCE OF URINE PIGMENT   Leukocytes,Ua   (*)    Value: TEST NOT REPORTED DUE TO COLOR INTERFERENCE OF URINE PIGMENT   All other components within normal limits  BASIC METABOLIC PANEL - Abnormal; Notable for the following components:   Glucose, Bld 116 (*)    All other components within normal limits  URINALYSIS, MICROSCOPIC (REFLEX) - Abnormal; Notable for the following components:   Bacteria, UA FEW (*)    All other components within normal limits  CBC    EKG None  Radiology Ct Renal Stone Study  Result Date: 03/03/2019 CLINICAL DATA:  Left flank pain and gross hematuria. EXAM: CT ABDOMEN AND PELVIS WITHOUT CONTRAST TECHNIQUE: Multidetector CT imaging of the abdomen and pelvis was performed following the standard protocol without IV contrast. COMPARISON:  None FINDINGS: Lower chest: No acute abnormality. Hepatobiliary: No focal liver abnormality is seen. No gallstones, gallbladder wall thickening, or biliary dilatation. Pancreas: Unremarkable. No pancreatic ductal dilatation or surrounding inflammatory changes. Spleen: Normal in size without focal abnormality. Adrenals/Urinary Tract: Normal adrenal glands. No right hydronephrosis or hydroureter. Punctate stone within the upper pole of right kidney measures 2-3 mm. 2 mm right mid kidney stone also noted. Several tiny left renal calculi are also noted. Left pelvocaliectasis is  identified. At the left UVJ there is a 4 mm stone, image 55/6. Within the dependent portion of the bladder there is a 3 mm calculus, image 87/3. Stomach/Bowel: Stomach is normal. No bowel wall thickening, inflammation or dilatation. Vascular/Lymphatic: No significant vascular findings are present. No enlarged abdominal or pelvic lymph nodes. Reproductive: Prostate is unremarkable. Other: Fat containing umbilical hernia is identified, image 54/3. Musculoskeletal: No acute or significant osseous findings. IMPRESSION: 1. Left  UPJ calculus measures 4 mm and results in left pelvocaliectasis. 2. Small bilateral renal calculi. Electronically Signed   By: Kerby Moors M.D.   On: 03/03/2019 11:35    Procedures Procedures (including critical care time)  Medications Ordered in ED Medications - No data to display   Initial Impression / Assessment and Plan / ED Course  I have reviewed the triage vital signs and the nursing notes.  Pertinent labs & imaging results that were available during my care of the patient were reviewed by me and considered in my medical decision making (see chart for details).        Patient seen and examined.  At time of exam, patient appears well.  No acute distress.  He has some mild soreness in the left flank.  Will monitor.  Agrees to proceed with CT imaging to evaluate for/confirm ureteral stone.  Vital signs reviewed and are as follows: BP 109/72 (BP Location: Left Arm)    Pulse 75    Temp 98.1 F (36.7 C) (Oral)    Resp 20    SpO2 99%   Updated patient and wife now bedside on CT imaging findings.  Pain remains controlled.  Patient counseled on kidney stone treatment. Urged patient to strain urine and save any stones. Urged urology follow-up and return to [facility] with any complications. Counseled patient to maintain good fluid intake.   Counseled patient on use of Flomax.   Patient counseled on use of narcotic pain medications. Counseled not to combine these medications  with others containing tylenol. Urged not to drink alcohol, drive, or perform any other activities that requires focus while taking these medications. The patient verbalizes understanding and agrees with the plan.  Final Clinical Impressions(s) / ED Diagnoses   Final diagnoses:  Left ureteral stone  Gross hematuria   Patient with 4 mm left UPJ stone consistent with the patient's earlier symptoms.  His pain is been better controlled after arrival to the emergency department and he is in no distress.  No signs of UTI.  No indications for admission or emergent urology evaluation today.  Patient will be given follow-up, pain medications for home, Flomax.  Return instructions as above.  ED Discharge Orders         Ordered    oxyCODONE-acetaminophen (PERCOCET/ROXICET) 5-325 MG tablet  Every 6 hours PRN,   Status:  Discontinued     03/03/19 1153    tamsulosin (FLOMAX) 0.4 MG CAPS capsule  Daily     03/03/19 1201    oxyCODONE-acetaminophen (PERCOCET/ROXICET) 5-325 MG tablet  Every 6 hours PRN     03/03/19 1203           Carlisle Cater, PA-C 03/03/19 1618    Tegeler, Gwenyth Allegra, MD 03/03/19 1620

## 2019-03-03 NOTE — Discharge Instructions (Signed)
Please read and follow all provided instructions.  Your diagnoses today include:  1. Left ureteral stone   2. Gross hematuria     Tests performed today include:  Urine test that showed blood in your urine and no infection  CT scan which showed a 4 millimeter kidney on the left side  Blood test that showed normal kidney function  Vital signs. See below for your results today.   Medications prescribed:   Percocet (oxycodone/acetaminophen) - narcotic pain medication  DO NOT drive or perform any activities that require you to be awake and alert because this medicine can make you drowsy. BE VERY CAREFUL not to take multiple medicines containing Tylenol (also called acetaminophen). Doing so can lead to an overdose which can damage your liver and cause liver failure and possibly death.   Flomax (tamsulosin) - relaxes smooth muscle to help kidney stones pass  Take any prescribed medications only as directed.  Home care instructions:  Follow any educational materials contained in this packet.  Please double your fluid intake for the next several days. Strain your urine and save any stones that may pass.   BE VERY CAREFUL not to take multiple medicines containing Tylenol (also called acetaminophen). Doing so can lead to an overdose which can damage your liver and cause liver failure and possibly death.   Follow-up instructions: Please follow-up with your urologist or the urologist referral (provided on front page) in the next 1 week for further evaluation of your symptoms.  If you need to return to the Emergency Department, go to [facility] and not [facility]. The urologists are located at [facility] and can better care for you at this location.  Return instructions:  If you need to return to the Emergency Department, go to [facility] and not [facility]. The urologists are located at [facility] and can better care for you at this  location.   Please return to the Emergency Department if you experience worsening symptoms.  Please return if you develop fever or uncontrolled pain or vomiting.  Please return if you have any other emergent concerns.  Additional Information:  Your vital signs today were: BP 109/72 (BP Location: Left Arm)    Pulse 75    Temp 98.1 F (36.7 C) (Oral)    Resp 20    SpO2 99%  If your blood pressure (BP) was elevated above 135/85 this visit, please have this repeated by your doctor within one month. --------------

## 2019-05-16 DIAGNOSIS — N200 Calculus of kidney: Secondary | ICD-10-CM | POA: Insufficient documentation

## 2019-05-16 DIAGNOSIS — E785 Hyperlipidemia, unspecified: Secondary | ICD-10-CM | POA: Insufficient documentation

## 2019-05-16 DIAGNOSIS — Z8249 Family history of ischemic heart disease and other diseases of the circulatory system: Secondary | ICD-10-CM

## 2019-05-16 HISTORY — DX: Family history of ischemic heart disease and other diseases of the circulatory system: Z82.49

## 2019-05-16 NOTE — Progress Notes (Deleted)
Complete Physical  Assessment and Plan:  Timothy Newman was seen today for annual exam.  Diagnoses and all orders for this visit:  Encounter for medical examination to establish care  Encounter for routine adult health examination without abnormal findings  Obesity (BMI 30.0-34.9) Long discussion about weight loss, diet, and exercise Recommended diet heavy in fruits and veggies and low in animal meats, cheeses, and dairy products, appropriate calorie intake Patient working on cutting down on sodas, working out Discussed appropriate weight for height  and initial goal (220lb) Follow up at next visit  Kidney stones ***  Medication management -     CBC with Differential/Platelet -     COMPLETE METABOLIC PANEL WITH GFR -     Urinalysis w microscopic + reflex cultur -     Magnesium  Screening for cardiovascular condition -     EKG 12-Lead  Screening cholesterol level -     Lipid panel  Screening for thyroid disorder -     TSH  Screening for diabetes mellitus -     Hemoglobin A1c  Vitamin D deficiency -     VITAMIN D 25 Hydroxy (Vit-D Deficiency, Fractures)    Discussed med's effects and SE's. Screening labs and tests as requested with regular follow-up as recommended. Over 40 minutes of exam, counseling, chart review and critical decision making was performed  Future Appointments  Date Time Provider [place]  05/19/2019  3:00 PM Liane Comber, NP [facility] None  05/18/2020  3:00 PM Liane Comber, NP [facility] None     HPI 48 y.o. male presents for a complete physical. He has Umbilical hernia - 18mm; Obesity (BMI 30.0-34.9); and Kidney stones on their problem list. He was previously seeing Dr. Conley Canal upstairs, moved care here as recommended by his wife in 2019.   He works in Building control surveyor, very active at work.  Goes to the gym 2 days a week as well.   This year he presented to ED on 03/03/2019 for left ureteral stone which did pass  spontaneously. CT scan at that time did show numerous small stones in bilateral kidneys. ***  BMI is There is no height or weight on file to calculate BMI., he has been working on diet and exercise, down from 8 months ago, down from 267lb. Quit sodas. He drinks 2-3 cups of coffee daily. Drinks ~1 gallon water daily. Sleeps 7 hours most days. Gym 2 days a week. He does intermittent fasting, skips breakfast. Typically has protein + veggies for dinner, lunch varies. Eats 2-3 servings of red meet weekly.  Wt Readings from Last 3 Encounters:  05/14/18 240 lb 3.2 oz (109 kg)  06/09/13 261 lb (118.4 kg)  05/12/13 261 lb 6.4 oz (118.6 kg)   His blood pressure has been controlled at home, today their BP is   He does workout. He denies chest pain, shortness of breath, dizziness.   His blood pressure {HAS HAS NOT:18834} been controlled at home, today their BP is    He {DOES_DOES [id number] workout. He denies chest pain, shortness of breath, dizziness.   He {ACTION; IS/IS GI:[id number] on cholesterol medication and denies myalgias. His cholesterol {ACTION; IS/IS NOT:[id number]} at goal. The cholesterol last visit was:   Lab Results  Component Value Date   CHOL 191 05/14/2018   HDL 49 05/14/2018   LDLCALC 124 (H) 05/14/2018   TRIG 85 05/14/2018   CHOLHDL 3.9 05/14/2018   Last A1C in the office was:  Lab Results  Component Value Date  HGBA1C 5.1 05/14/2018    Last GFR:  Lab Results  Component Value Date   GFRNONAA >60 03/03/2019   Patient is *** on Vitamin D supplement.   Lab Results  Component Value Date   VD25OH 39 05/14/2018       Current Medications:  Current Outpatient Medications on File Prior to Visit  Medication Sig Dispense Refill  . oxyCODONE-acetaminophen (PERCOCET/ROXICET) 5-325 MG tablet Take 1 tablet by mouth every 6 (six) hours as needed for severe pain. 10 tablet 0  . tamsulosin (FLOMAX) 0.4 MG CAPS capsule Take 1 capsule (0.4 mg total) by mouth daily. 7 capsule 0   No  current facility-administered medications on file prior to visit.    Allergies:  No Known Allergies Health Maintenance:   There is no immunization history on file for this patient.  Tetanus: 2016 Pneumovax: -  Flu vaccine: declines  Colonoscopy: due age 52  EGD: -  Eye Exam: Dr. Truman Hayward at [facility] on [address], last visit 2018, wears glasses Dentist: Dr. Cloretta Ned, last visit 2018,   Patient Care Team: Unk Pinto, MD as PCP - General (Internal Medicine)  Medical History:  has Umbilical hernia - 44mm; Obesity (BMI 30.0-34.9); and Kidney stones on their problem list. Surgical History:  He  has a past surgical history that includes Incision and drainage abscess / hematoma of bursa / knee / thigh (Right, ?2004). Family History:  His family history includes AAA (abdominal aortic aneurysm) in his maternal grandmother; Asthma in his mother; Cancer in his paternal grandmother; Fibromyalgia in his mother; Heart failure (age of onset: 79) in his paternal grandfather; Hyperlipidemia in his mother; Lung cancer in his maternal grandfather; Melanoma in his father. Social History:   reports that he quit smoking about 27 years ago. His smoking use included cigarettes. He has a 3.00 pack-year smoking history. He quit smokeless tobacco use about 4 years ago.  His smokeless tobacco use included snuff. He reports that he does not drink alcohol or use drugs. Review of Systems:  Review of Systems  Constitutional: Negative for malaise/fatigue and weight loss.  HENT: Negative for hearing loss and tinnitus.   Eyes: Negative for blurred vision and double vision.  Respiratory: Negative for cough, sputum production, shortness of breath and wheezing.   Cardiovascular: Negative for chest pain, palpitations, orthopnea, claudication, leg swelling and PND.  Gastrointestinal: Negative for abdominal pain, blood in stool, constipation, diarrhea, heartburn, melena, nausea and vomiting.  Genitourinary: Negative.    Musculoskeletal: Negative for falls, joint pain and myalgias.  Skin: Negative for rash.  Neurological: Negative for dizziness, tingling, sensory change, weakness and headaches.  Endo/Heme/Allergies: Negative for polydipsia.  Psychiatric/Behavioral: Negative.  Negative for depression, memory loss, substance abuse and suicidal ideas. The patient is not nervous/anxious and does not have insomnia.   All other systems reviewed and are negative.   Physical Exam: Estimated body mass index is 33.03 kg/m as calculated from the following:   Height as of 05/14/18: 5' 11.5" (1.816 m).   Weight as of 05/14/18: 240 lb 3.2 oz (109 kg). There were no vitals taken for this visit. General Appearance: Well nourished, in no apparent distress.  Eyes: PERRLA, EOMs, conjunctiva no swelling or erythema, normal fundi and vessels.  Sinuses: No Frontal/maxillary tenderness  ENT/Mouth: Ext aud canals clear, normal light reflex with TMs without erythema, bulging. Good dentition. No erythema, swelling, or exudate on post pharynx. Tonsils not swollen or erythematous. Hearing normal.  Neck: Supple, thyroid normal. No bruits  Respiratory:  Respiratory effort normal, BS equal bilaterally without rales, rhonchi, wheezing or stridor.  Cardio: RRR without murmurs, rubs or gallops. Brisk peripheral pulses without edema.  Chest: symmetric, with normal excursions and percussion.  Abdomen: Soft, nontender, no guarding, rebound, hernias, masses, or organomegaly.  Lymphatics: Non tender without lymphadenopathy.  Genitourinary: Defer Musculoskeletal: Full ROM all peripheral extremities,5/5 strength, and normal gait.  Skin: Warm, dry without rashes, lesions, ecchymosis. Neuro: Cranial nerves intact, reflexes equal bilaterally. Normal muscle tone, no cerebellar symptoms. Sensation intact.  Psych: Awake and oriented X 3, normal affect, Insight and Judgment appropriate.   EKG: WNL, sinus rhythm with PVCs   AAA [place]: ***  Gorden Harms  Tieara Flitton 1:55 PM [facility]

## 2019-05-19 ENCOUNTER — Encounter: Payer: Self-pay | Admitting: Adult Health

## 2019-05-19 DIAGNOSIS — Z0000 Encounter for general adult medical examination without abnormal findings: Secondary | ICD-10-CM

## 2019-10-24 ENCOUNTER — Encounter: Payer: Self-pay | Admitting: Adult Health

## 2019-10-24 NOTE — Progress Notes (Signed)
Complete Physical  Assessment and Plan:  Timothy Newman was seen today for annual exam.  Diagnoses and all orders for this visit:  Encounter for routine adult health examination with abnormal findings Health Maintenance- Discussed STD testing, safe sex, alcohol and drug awareness, drinking and driving dangers, wearing a seat belt and general safety measures for young adult. Advised colonoscopy due next year  Obesity (BMI 30.0-34.9) Long discussion about weight loss, diet, and exercise Recommended diet heavy in fruits and veggies and low in animal meats, cheeses, and dairy products, appropriate calorie intake Patient working on cutting down on sodas, working out Discussed appropriate weight for height  and initial goal (215lb) Follow up at next visit  Medication management -     CBC with Differential/Platelet -     COMPLETE METABOLIC PANEL WITH GFR -     Urinalysis w microscopic + reflex cultur -     Magnesium  Hyperlipidemia Currently managed by lifestyle LDL goal 100 Continue low cholesterol diet and exercise.  Reduced saturated/trans fats, reduced animal fats, increase fiber Check lipid panel.  -     Lipid panel -     TSH  Screening for diabetes mellitus -     Defer A1C; high deductible plan; had normal in 2019, no family history, working on lifestyle/weight reduction  Vitamin D deficiency Continue supplement for goal 60-100 -     VITAMIN D 25 Hydroxy (Vit-D Deficiency, Fractures)  Kidney stones Several remaining per CT in ED reviewed with patient Push water intake Will request stone analysis report from [facility]  Family history malignant melanoma No highly concerning lesions noted today; patient is requesting derm referral to establish care and for full body skin check. Referral placed.   Discussed med's effects and SE's. Screening labs and tests as requested with regular follow-up as recommended. Over 40 minutes of exam, counseling, chart review and critical decision  making was performed  Future Appointments  Date Time Provider [place]  10/28/2020  3:00 PM Timothy Comber, NP [facility] None     HPI 49 y.o. male patient presents for a complete physical. He has Umbilical hernia - 1mm; Obesity (BMI 30.0-34.9); Kidney stones; and Hyperlipidemia on their problem list.   He was previously seeing Timothy Newman upstairs, moved care here in 2019 as recommended by his wife, Timothy Newman.   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     Hx of kidney stones; he presented to ED 03/03/2019 with flank pain and gross hematuria and was found to have non-obstructing 4 mm UPJ stone which he passed spontaneously without complication. CT at that time also showed numerous other small stones bilaterally. He reports did take stone for analysis at [facility], never heard back regarding results. Report requested today.   BMI is Body mass index is 30.58 kg/m., he has been working on diet and exercise, down from 269lb. Quit sodas. He drinks 2-3 cups of coffee daily.  Drinks ~1 gallon water daily. Sleeps 7 hours most days. Gym 2 days a week.  He does intermittent fasting, skips breakfast.  Typically has protein + veggies for dinner, lunch varies.  Eats 2-3 servings of red meat weekly.  Wt Readings from Last 3 Encounters:  10/27/19 230 lb 3.2 oz (104.4 kg)  05/14/18 240 lb 3.2 oz (109 kg)  06/09/13 261 lb (118.4 kg)   His blood pressure has been controlled at home, today their BP is BP: 110/82 He does  workout. He denies chest pain, shortness of breath, dizziness.    He is not on cholesterol medication and denies myalgias. His cholesterol is not at goal. The cholesterol last visit was:   Lab Results  Component Value Date   CHOL 191 05/14/2018   HDL 49 05/14/2018   LDLCALC 124 (H) 05/14/2018   TRIG 85 05/14/2018   CHOLHDL 3.9 05/14/2018    He has been working on diet and exercise for glucose management,  and denies increased appetite, nausea, paresthesia of the feet, polydipsia, polyuria and visual disturbances. Last A1C in the office was:  Lab Results  Component Value Date   HGBA1C 5.1 05/14/2018    Last GFR:  Lab Results  Component Value Date   GFRNONAA >60 03/03/2019   Patient is on Vitamin D supplement, taking 5000 IU daily.   Lab Results  Component Value Date   VD25OH 39 05/14/2018       Current Medications:  Current Outpatient Medications on File Prior to Visit  Medication Sig Dispense Refill  . Cholecalciferol (VITAMIN D) 125 MCG (5000 UT) CAPS Take 1 capsule by mouth daily.    . Multiple Vitamin (MULTIVITAMIN) tablet Take 1 tablet by mouth daily.    Marland Kitchen OVER THE COUNTER MEDICATION Airborne gummy 2 per day for Vitamin C    . OVER THE COUNTER MEDICATION Vitamin B12 1 gummy daily.     No current facility-administered medications on file prior to visit.   Allergies:  No Known Allergies Health Maintenance:   There is no immunization history on file for this patient.  Tetanus: 2016 Pneumovax: -  Flu vaccine: declines  Colonoscopy: -  EGD: -  Eye Exam: Dr. Truman Hayward at [facility] on [address], last visit 2018, wears glasses with computer Dentist: Dr. Cloretta Ned, last visit 2018?, has upcoming appointment in July 2021 Derm:   Patient Care Team: Timothy Pinto, MD as PCP - General (Internal Medicine)  Medical History:  has Umbilical hernia - 24mm; Obesity (BMI 30.0-34.9); Kidney stones; and Hyperlipidemia on their problem list. Surgical History:  He  has a past surgical history that includes Incision and drainage abscess / hematoma of bursa / knee / thigh (Right, ?2004). Family History:  His family history includes AAA (abdominal aortic aneurysm) in his maternal grandmother; Asthma in his mother; Cancer in his paternal grandmother; Fibromyalgia in his mother; Heart failure (age of onset: 62) in his paternal grandfather; Hyperlipidemia in his mother; Lung cancer in his  maternal grandfather; Melanoma in his father. Social History:   reports that he quit smoking about 27 years ago. His smoking use included cigarettes. He has a 3.00 pack-year smoking history. He quit smokeless tobacco use about 4 years ago.  His smokeless tobacco use included snuff. He reports that he does not drink alcohol or use drugs. Review of Systems:  Review of Systems  Constitutional: Negative for malaise/fatigue and weight loss.  HENT: Negative for hearing loss and tinnitus.   Eyes: Negative for blurred vision and double vision.  Respiratory: Negative for cough, sputum production, shortness of breath and wheezing.   Cardiovascular: Negative for chest pain, palpitations, orthopnea, claudication, leg swelling and PND.  Gastrointestinal: Negative for abdominal pain, blood in stool, constipation, diarrhea, heartburn, melena, nausea and vomiting.  Genitourinary: Negative.   Musculoskeletal: Negative for falls, joint pain and myalgias.  Skin: Negative for rash.  Neurological: Negative for dizziness, tingling, sensory change, weakness and headaches.  Endo/Heme/Allergies: Negative for polydipsia.  Psychiatric/Behavioral: Negative.  Negative for depression, memory loss,  substance abuse and suicidal ideas. The patient is not nervous/anxious and does not have insomnia.   All other systems reviewed and are negative.   Physical Exam: Estimated body mass index is 30.58 kg/m as calculated from the following:   Height as of this encounter: 6' 0.75" (1.848 m).   Weight as of this encounter: 230 lb 3.2 oz (104.4 kg). BP 110/82   Pulse 64   Temp (!) 97.2 F (36.2 C)   Resp 16   Ht 6' 0.75" (1.848 m)   Wt 230 lb 3.2 oz (104.4 kg)   BMI 30.58 kg/m  General Appearance: Well nourished, in no apparent distress.  Eyes: PERRLA, EOMs, conjunctiva no swelling or erythema, normal fundi and vessels.  Sinuses: No Frontal/maxillary tenderness  ENT/Mouth: Ext aud canals clear, normal light reflex with TMs  without erythema, bulging. Good dentition. No erythema, swelling, or exudate on post pharynx. Tonsils not swollen or erythematous. Hearing normal.  Neck: Supple, thyroid normal. No bruits  Respiratory: Respiratory effort normal, BS equal bilaterally without rales, rhonchi, wheezing or stridor.  Cardio: RRR without murmurs, rubs or gallops. Brisk peripheral pulses without edema.  Chest: symmetric, with normal excursions and percussion.  Abdomen: Soft, nontender, no guarding, rebound, hernias, masses, or organomegaly.  Lymphatics: Non tender without lymphadenopathy.  Genitourinary: Defer Musculoskeletal: Full ROM all peripheral extremities, except L hip reduced internal rotation without pain, crepitus, popping, clicking; 5/5 strength, and normal gait.  Skin: Warm, dry without rashes, concerning lesions, ecchymosis. Neuro: Cranial nerves intact, reflexes equal bilaterally. Normal muscle tone, no cerebellar symptoms. Sensation intact.  Psych: Awake and oriented X 3, normal affect, Insight and Judgment appropriate.   EKG: WNL, sinus rhythm with PVCs from 2019 reviewed; defer  Izora Ribas 3:53 PM [facility]

## 2019-10-27 ENCOUNTER — Ambulatory Visit (INDEPENDENT_AMBULATORY_CARE_PROVIDER_SITE_OTHER): Payer: 59 | Admitting: Adult Health

## 2019-10-27 ENCOUNTER — Encounter: Payer: Self-pay | Admitting: Adult Health

## 2019-10-27 ENCOUNTER — Other Ambulatory Visit: Payer: Self-pay

## 2019-10-27 VITALS — BP 110/82 | HR 64 | Temp 97.2°F | Resp 16 | Ht 72.75 in | Wt 230.2 lb

## 2019-10-27 DIAGNOSIS — E669 Obesity, unspecified: Secondary | ICD-10-CM

## 2019-10-27 DIAGNOSIS — Z1329 Encounter for screening for other suspected endocrine disorder: Secondary | ICD-10-CM

## 2019-10-27 DIAGNOSIS — Z131 Encounter for screening for diabetes mellitus: Secondary | ICD-10-CM

## 2019-10-27 DIAGNOSIS — Z808 Family history of malignant neoplasm of other organs or systems: Secondary | ICD-10-CM

## 2019-10-27 DIAGNOSIS — Z8249 Family history of ischemic heart disease and other diseases of the circulatory system: Secondary | ICD-10-CM

## 2019-10-27 DIAGNOSIS — Z79899 Other long term (current) drug therapy: Secondary | ICD-10-CM

## 2019-10-27 DIAGNOSIS — Z0000 Encounter for general adult medical examination without abnormal findings: Secondary | ICD-10-CM

## 2019-10-27 DIAGNOSIS — E785 Hyperlipidemia, unspecified: Secondary | ICD-10-CM

## 2019-10-27 DIAGNOSIS — N200 Calculus of kidney: Secondary | ICD-10-CM

## 2019-10-27 DIAGNOSIS — K429 Umbilical hernia without obstruction or gangrene: Secondary | ICD-10-CM

## 2019-10-27 DIAGNOSIS — Z1283 Encounter for screening for malignant neoplasm of skin: Secondary | ICD-10-CM

## 2019-10-27 DIAGNOSIS — E66811 Obesity, class 1: Secondary | ICD-10-CM

## 2019-10-27 DIAGNOSIS — Z1389 Encounter for screening for other disorder: Secondary | ICD-10-CM

## 2019-10-27 NOTE — Patient Instructions (Addendum)
Timothy Newman , Thank you for taking time to come for your Annual Wellness Visit. I appreciate your ongoing commitment to your health goals. Please review the following plan we discussed and let me know if I can assist you in the future.   These are the goals we discussed: Goals    . DIET - EAT MORE FRUITS AND VEGETABLES     7+ servings (1/2 cup) daily, limit red meat intake to 6 oz/week    . Weight (lb) < 215 lb (97.5 kg)       This is a list of the screening recommended for you and due dates:  Health Maintenance  Topic Date Due  . COVID-19 Vaccine (1) Never done  . Flu Shot  01/25/2020  . Tetanus Vaccine  06/26/2024  . HIV Screening  Completed    * red meat is correlated with increased risk of cancer, cardiovascular disease, inflammation, recommend limiting as much as possible. Guidelines recommend limiting to 6 oz/week or less    Know what a healthy weight is for you (roughly BMI <25) and aim to maintain this  Aim for 7+ servings of fruits and vegetables daily  Avoid processed carbs (sugar, high fructose corn syrup), stick to whole grains, beans  65-80+ fluid ounces of water or unsweet tea for healthy kidneys  Limit to max 1-2 drink of alcohol per day, avoid drinking daily; avoid smoking/tobacco  Limit animal fats in diet for cholesterol and heart health - choose grass fed whenever available  Avoid highly processed foods, and foods high in saturated/trans fats  Aim for low stress - take time to unwind and care for your mental health  Aim for 150 min of moderate intensity exercise weekly for heart health, and weights twice weekly for bone health  Aim for 7-9 hours of sleep daily     High-Fiber Diet Fiber, also called dietary fiber, is a type of carbohydrate that is found in fruits, vegetables, whole grains, and beans. A high-fiber diet can have many health benefits. Your health care provider may recommend a high-fiber diet to help:  Prevent constipation. Fiber can  make your bowel movements more regular.  Lower your cholesterol.  Relieve the following conditions: ? Swelling of veins in the anus (hemorrhoids). ? Swelling and irritation (inflammation) of specific areas of the digestive tract (uncomplicated diverticulosis). ? A problem of the large intestine (colon) that sometimes causes pain and diarrhea (irritable bowel syndrome, IBS).  Prevent overeating as part of a weight-loss plan.  Prevent heart disease, type 2 diabetes, and certain cancers. What is my plan? The recommended daily fiber intake in grams (g) includes:  38 g for men age 28 or younger.  30 g for men over age 47.  93 g for women age 44 or younger.  21 g for women over age 47. You can get the recommended daily intake of dietary fiber by:  Eating a variety of fruits, vegetables, grains, and beans.  Taking a fiber supplement, if it is not possible to get enough fiber through your diet. What do I need to know about a high-fiber diet?  It is better to get fiber through food sources rather than from fiber supplements. There is not a lot of research about how effective supplements are.  Always check the fiber content on the nutrition facts label of any prepackaged food. Look for foods that contain 5 g of fiber or more per serving.  Talk with a diet and nutrition specialist (dietitian) if you have  questions about specific foods that are recommended or not recommended for your medical condition, especially if those foods are not listed below.  Gradually increase how much fiber you consume. If you increase your intake of dietary fiber too quickly, you may have bloating, cramping, or gas.  Drink plenty of water. Water helps you to digest fiber. What are tips for following this plan?  Eat a wide variety of high-fiber foods.  Make sure that half of the grains that you eat each day are whole grains.  Eat breads and cereals that are made with whole-grain flour instead of refined  flour or white flour.  Eat brown rice, bulgur wheat, or millet instead of white rice.  Start the day with a breakfast that is high in fiber, such as a cereal that contains 5 g of fiber or more per serving.  Use beans in place of meat in soups, salads, and pasta dishes.  Eat high-fiber snacks, such as berries, raw vegetables, nuts, and popcorn.  Choose whole fruits and vegetables instead of processed forms like juice or sauce. What foods can I eat?  Fruits Berries. Pears. Apples. Oranges. Avocado. Prunes and raisins. Dried figs. Vegetables Sweet potatoes. Spinach. Kale. Artichokes. Cabbage. Broccoli. Cauliflower. Green peas. Carrots. Squash. Grains Whole-grain breads. Multigrain cereal. Oats and oatmeal. Brown rice. Barley. Bulgur wheat. Eva. Quinoa. Bran muffins. Popcorn. Rye wafer crackers. Meats and other proteins Navy, kidney, and pinto beans. Soybeans. Split peas. Lentils. Nuts and seeds. Dairy Fiber-fortified yogurt. Beverages Fiber-fortified soy milk. Fiber-fortified orange juice. Other foods Fiber bars. The items listed above may not be a complete list of recommended foods and beverages. Contact a dietitian for more options. What foods are not recommended? Fruits Fruit juice. Cooked, strained fruit. Vegetables Fried potatoes. Canned vegetables. Well-cooked vegetables. Grains White bread. Pasta made with refined flour. White rice. Meats and other proteins Fatty cuts of meat. Fried chicken or fried fish. Dairy Milk. Yogurt. Cream cheese. Sour cream. Fats and oils Butters. Beverages Soft drinks. Other foods Cakes and pastries. The items listed above may not be a complete list of foods and beverages to avoid. Contact a dietitian for more information. Summary  Fiber is a type of carbohydrate. It is found in fruits, vegetables, whole grains, and beans.  There are many health benefits of eating a high-fiber diet, such as preventing constipation, lowering blood  cholesterol, helping with weight loss, and reducing your risk of heart disease, diabetes, and certain cancers.  Gradually increase your intake of fiber. Increasing too fast can result in cramping, bloating, and gas. Drink plenty of water while you increase your fiber.  The best sources of fiber include whole fruits and vegetables, whole grains, nuts, seeds, and beans. This information is not intended to replace advice given to you by your health care provider. Make sure you discuss any questions you have with your health care provider. Document Revised: 04/16/2017 Document Reviewed: 04/16/2017 Elsevier Patient Education  2020 [employer].

## 2019-10-28 LAB — CBC WITH DIFFERENTIAL/PLATELET
Absolute Monocytes: 874 cells/uL (ref 200–950)
Basophils Absolute: 62 cells/uL (ref 0–200)
Basophils Relative: 0.8 %
Eosinophils Absolute: 164 cells/uL (ref 15–500)
Eosinophils Relative: 2.1 %
HCT: 42.9 % (ref 38.5–50.0)
Hemoglobin: 14.7 g/dL (ref 13.2–17.1)
Lymphs Abs: 2278 cells/uL (ref 850–3900)
MCH: 32.3 pg (ref 27.0–33.0)
MCHC: 34.3 g/dL (ref 32.0–36.0)
MCV: 94.3 fL (ref 80.0–100.0)
MPV: 9.9 fL (ref 7.5–12.5)
Monocytes Relative: 11.2 %
Neutro Abs: 4423 cells/uL (ref 1500–7800)
Neutrophils Relative %: 56.7 %
Platelets: 266 10*3/uL (ref 140–400)
RBC: 4.55 10*6/uL (ref 4.20–5.80)
RDW: 11.8 % (ref 11.0–15.0)
Total Lymphocyte: 29.2 %
WBC: 7.8 10*3/uL (ref 3.8–10.8)

## 2019-10-28 LAB — URINALYSIS, ROUTINE W REFLEX MICROSCOPIC
Bilirubin Urine: NEGATIVE
Glucose, UA: NEGATIVE
Hgb urine dipstick: NEGATIVE
Ketones, ur: NEGATIVE
Leukocytes,Ua: NEGATIVE
Nitrite: NEGATIVE
Protein, ur: NEGATIVE
Specific Gravity, Urine: 1.022 (ref 1.001–1.03)
pH: 7 (ref 5.0–8.0)

## 2019-10-28 LAB — LIPID PANEL
Cholesterol: 186 mg/dL (ref <200)
HDL: 44 mg/dL (ref >=40)
LDL Cholesterol (Calc): 119 mg/dL (calc) — ABNORMAL HIGH
Non-HDL Cholesterol (Calc): 142 mg/dL (calc) — ABNORMAL HIGH (ref <130)
Total CHOL/HDL Ratio: 4.2 (calc) (ref <5.0)
Triglycerides: 119 mg/dL (ref <150)

## 2019-10-28 LAB — COMPLETE METABOLIC PANEL WITH GFR
AG Ratio: 1.7 (calc) (ref 1.0–2.5)
ALT: 17 U/L (ref 9–46)
AST: 14 U/L (ref 10–40)
Albumin: 4.3 g/dL (ref 3.6–5.1)
Alkaline phosphatase (APISO): 41 U/L (ref 36–130)
BUN: 20 mg/dL (ref 7–25)
CO2: 27 mmol/L (ref 20–32)
Calcium: 9.3 mg/dL (ref 8.6–10.3)
Chloride: 104 mmol/L (ref 98–110)
Creat: 1.16 mg/dL (ref 0.60–1.35)
GFR, Est African American: 85 mL/min/{1.73_m2} (ref >=60)
GFR, Est Non African American: 74 mL/min/{1.73_m2} (ref >=60)
Globulin: 2.5 g/dL (calc) (ref 1.9–3.7)
Glucose, Bld: 86 mg/dL (ref 65–99)
Potassium: 3.9 mmol/L (ref 3.5–5.3)
Sodium: 139 mmol/L (ref 135–146)
Total Bilirubin: 0.8 mg/dL (ref 0.2–1.2)
Total Protein: 6.8 g/dL (ref 6.1–8.1)

## 2019-10-28 LAB — VITAMIN D 25 HYDROXY (VIT D DEFICIENCY, FRACTURES): Vit D, 25-Hydroxy: 37 ng/mL (ref 30–100)

## 2019-10-28 LAB — TSH: TSH: 0.88 mIU/L (ref 0.40–4.50)

## 2019-10-28 LAB — MAGNESIUM: Magnesium: 2.2 mg/dL (ref 1.5–2.5)

## 2020-04-01 ENCOUNTER — Encounter: Payer: Self-pay | Admitting: Physician Assistant

## 2020-04-01 ENCOUNTER — Telehealth (INDEPENDENT_AMBULATORY_CARE_PROVIDER_SITE_OTHER): Payer: 59 | Admitting: Physician Assistant

## 2020-04-01 VITALS — Temp 99.5°F | Ht 72.0 in | Wt 235.0 lb

## 2020-04-01 DIAGNOSIS — J069 Acute upper respiratory infection, unspecified: Secondary | ICD-10-CM

## 2020-04-01 LAB — POC COVID19 BINAXNOW: SARS Coronavirus 2 Ag: NEGATIVE

## 2020-04-01 MED ORDER — ALBUTEROL SULFATE HFA 108 (90 BASE) MCG/ACT IN AERS: 2.0000 | INHALATION_SPRAY | Freq: Four times a day (QID) | RESPIRATORY_TRACT | 0 refills | Status: DC | PRN

## 2020-04-01 MED ORDER — AZITHROMYCIN 250 MG PO TABS: ORAL_TABLET | ORAL | 0 refills | Status: DC

## 2020-04-01 NOTE — Progress Notes (Signed)
Virtual Visit via Telephone Note   This visit type was conducted due to national recommendations for restrictions regarding the COVID-19 Pandemic (e.g. social distancing) in an effort to limit this patient's exposure and mitigate transmission in our community.  Due to his co-morbid illnesses, this patient is at least at moderate risk for complications without adequate follow up.  This format is felt to be most appropriate for this patient at this time.  The patient did not have access to video technology/had technical difficulties with video requiring transitioning to audio format only (telephone).  All issues noted in this document were discussed and addressed.  No physical exam could be performed with this format.  Patient verbally consented to a telehealth visit.   Date:  04/01/2020   ID:  Timothy Newman, DOB 1970/07/22, MRN [id number]  Patient Location: Home Provider Location: Office  PCP:  Marge Duncans, PA-C    Chief Complaint:  URI  History of Present Illness:    Timothy Newman is a 49 y.o. male with URI and possible COVID Pt has been staying with his mother who was diagnosed with COVID on 9/27 - she is actually hosptialized now Pt states he began having sinus congestion and cough since yesterday and also a fever up to 101.5  The patient does have symptoms concerning for COVID-19 infection (fever, chills, cough, or new shortness of breath).    Past Medical History:  Diagnosis Date  . Family history of abdominal aortic aneurysm (AAA) 05/16/2019  . Inflamed sebaceous cyst s/p excision Nov 2014 05/12/2013  . MRSA cellulitis ?2004   I&D in ED.  IV Vanco w PICC  . Umbilical hernia - 35mm 27/11/2374   Past Surgical History:  Procedure Laterality Date  . INCISION AND DRAINAGE ABSCESS / HEMATOMA OF BURSA / KNEE / THIGH Right ?2004   ?Dr Viona GilmoreDeon Pilling      No outpatient medications have been marked as taking for the 04/01/20 encounter (Video Visit) with Marge Duncans, PA-C.      Allergies:   Patient has no known allergies.   Social History   Tobacco Use  . Smoking status: Former Smoker    Packs/day: 1.00    Years: 3.00    Pack years: 3.00    Types: Cigarettes    Quit date: 05/14/1992    Years since quitting: 27.9  . Smokeless tobacco: Former Systems developer    Types: Snuff    Quit date: 05/15/2015  Vaping Use  . Vaping Use: Never used  Substance Use Topics  . Alcohol use: No  . Drug use: No     Family Hx: The patient's family history includes AAA (abdominal aortic aneurysm) in his maternal grandmother; Asthma in his mother; Cancer in his paternal grandmother; Fibromyalgia in his mother; Heart failure (age of onset: 61) in his paternal grandfather; Hyperlipidemia in his mother; Lung cancer in his maternal grandfather; Melanoma in his father.  ROS:   Please see the history of present illness.    All other systems reviewed and are negative.  Labs/Other Tests and Data Reviewed:    Recent Labs: 10/27/2019: ALT 17; BUN 20; Creat 1.16; Hemoglobin 14.7; Magnesium 2.2; Platelets 266; Potassium 3.9; Sodium 139; TSH 0.88   Recent Lipid Panel Lab Results  Component Value Date/Time   CHOL 186 10/27/2019 03:40 PM   TRIG 119 10/27/2019 03:40 PM   HDL 44 10/27/2019 03:40 PM   CHOLHDL 4.2 10/27/2019 03:40 PM   LDLCALC 119 (H) 10/27/2019 03:40 PM  Wt Readings from Last 3 Encounters:  04/01/20 235 lb (106.6 kg)  10/27/19 230 lb 3.2 oz (104.4 kg)  05/14/18 240 lb 3.2 oz (109 kg)     Objective:    Vital Signs:  Temp 99.5 F (37.5 C)   Ht 6' (1.829 m)   Wt 235 lb (106.6 kg)   BMI 31.87 kg/m    VITAL SIGNS:  reviewed  ASSESSMENT & PLAN:    1. URI /bronchitis with high suspicion of COVID  - rapid test negative but COVID PCR sendout done --- rx for zpack as directed and proventil to use as needed  COVID-19 Education: The signs and symptoms of COVID-19 were discussed with the patient and how to seek care for testing (follow up with PCP or arrange E-visit).  The importance of social distancing was discussed today.  Time:   Today, I have spent 10 minutes with the patient with telehealth technology discussing the above problems.     Medication Adjustments/Labs and Tests Ordered: Current medicines are reviewed at length with the patient today.  Concerns regarding medicines are outlined above.   Tests Ordered: Orders Placed This Encounter  Procedures  . Novel Coronavirus, NAA ([facility])  . POC COVID-19 BinaxNow    Medication Changes: Meds ordered this encounter  Medications  . azithromycin (ZITHROMAX) 250 MG tablet    Sig: 2 po day one then one po days 2-5    Dispense:  6 each    Refill:  0    Order Specific Question:   Supervising Provider    AnswerShelton Silvas  . albuterol (VENTOLIN HFA) 108 (90 Base) MCG/ACT inhaler    Sig: Inhale 2 puffs into the lungs every 6 (six) hours as needed for wheezing or shortness of breath.    Dispense:  8 g    Refill:  0    Order Specific Question:   Supervising Provider    AnswerShelton Silvas    Follow Up:  Virtual Visit  prn  Signed, Webb Silversmith, PA-C  04/01/2020 11:21 AM    [place]

## 2020-04-02 LAB — SARS-COV-2, NAA 2 DAY TAT

## 2020-04-02 LAB — NOVEL CORONAVIRUS, NAA: SARS-CoV-2, NAA: DETECTED — AB

## 2020-04-03 ENCOUNTER — Other Ambulatory Visit (HOSPITAL_COMMUNITY): Payer: Self-pay | Admitting: Family

## 2020-04-03 ENCOUNTER — Ambulatory Visit (HOSPITAL_COMMUNITY)
Admission: RE | Admit: 2020-04-03 | Discharge: 2020-04-03 | Disposition: A | Discharge status: Home / Self Care | Payer: 59 | Source: Ambulatory Visit | Attending: Pulmonary Disease | Admitting: Pulmonary Disease

## 2020-04-03 ENCOUNTER — Telehealth (HOSPITAL_COMMUNITY): Payer: Self-pay | Admitting: Emergency Medicine

## 2020-04-03 VITALS — BP 116/71 | HR 78 | Temp 99.9°F | Resp 17

## 2020-04-03 DIAGNOSIS — U071 COVID-19: Secondary | ICD-10-CM

## 2020-04-03 MED ORDER — METHYLPREDNISOLONE SODIUM SUCC 125 MG IJ SOLR: 125.0000 mg | Freq: Once | INTRAMUSCULAR | Status: DC | PRN

## 2020-04-03 MED ORDER — EPINEPHRINE 0.3 MG/0.3ML IJ SOAJ: 0.3000 mg | Freq: Once | INTRAMUSCULAR | Status: DC | PRN

## 2020-04-03 MED ORDER — ALBUTEROL SULFATE HFA 108 (90 BASE) MCG/ACT IN AERS: 2.0000 | INHALATION_SPRAY | Freq: Once | RESPIRATORY_TRACT | Status: DC | PRN

## 2020-04-03 MED ORDER — DIPHENHYDRAMINE HCL 50 MG/ML IJ SOLN: 50.0000 mg | Freq: Once | INTRAMUSCULAR | Status: DC | PRN

## 2020-04-03 MED ORDER — SODIUM CHLORIDE 0.9 % IV SOLN: INTRAVENOUS | Status: DC | PRN

## 2020-04-03 MED ORDER — IBUPROFEN 200 MG PO TABS: 600.0000 mg | ORAL_TABLET | Freq: Once | ORAL | Status: DC

## 2020-04-03 MED ORDER — SODIUM CHLORIDE 0.9 % IV SOLN: Freq: Once | INTRAVENOUS | Status: AC

## 2020-04-03 MED ORDER — FAMOTIDINE IN NACL 20-0.9 MG/50ML-% IV SOLN: 20.0000 mg | Freq: Once | INTRAVENOUS | Status: DC | PRN

## 2020-04-03 NOTE — Telephone Encounter (Signed)
Called pt and explained possible mAb treatment. Sx started 03/31/20. Tested positive 04/02/20. Sx include fever, diarrhea, headache, body aches, cough, and minor shortness of breath. Hx smoking (quit 20 year ago, smoked 10 years). Denies medical history. Pt said mother is in hospital with [facility]. Pt said he can come in today for tx. I informed him an APP will call back to schedule an appointment.

## 2020-04-03 NOTE — Progress Notes (Signed)
I connected by phone with Timothy Newman on 04/03/2020 at 3:00 PM to discuss the potential use of a new treatment for mild to moderate COVID-19 viral infection in non-hospitalized patients.  This patient is a 49 y.o. male that meets the FDA criteria for Emergency Use Authorization of COVID monoclonal antibody casirivimab/imdevimab or bamlanivimab/eteseviamb.  Has a (+) direct SARS-CoV-2 viral test result  Has mild or moderate COVID-19   Is NOT hospitalized due to COVID-19  Is within 10 days of symptom onset  Has at least one of the high risk factor(s) for progression to severe COVID-19 and/or hospitalization as defined in EUA.  Specific high risk criteria : Other high risk medical condition per CDC:  Former smoker   I have spoken and communicated the following to the patient or parent/caregiver regarding COVID monoclonal antibody treatment:  1. FDA has authorized the emergency use for the treatment of mild to moderate COVID-19 in adults and pediatric patients with positive results of direct SARS-CoV-2 viral testing who are 62 years of age and older weighing at least 40 kg, and who are at high risk for progressing to severe COVID-19 and/or hospitalization.  2. The significant known and potential risks and benefits of COVID monoclonal antibody, and the extent to which such potential risks and benefits are unknown.  3. Information on available alternative treatments and the risks and benefits of those alternatives, including clinical trials.  4. Patients treated with COVID monoclonal antibody should continue to self-isolate and use infection control measures (e.g., wear mask, isolate, social distance, avoid sharing personal items, clean and disinfect "high touch" surfaces, and frequent handwashing) according to CDC guidelines.   5. The patient or parent/caregiver has the option to accept or refuse COVID monoclonal antibody treatment.  After reviewing this information with the patient, the  patient has agreed to receive one of the available covid 19 monoclonal antibodies and will be provided an appropriate fact sheet prior to infusion. Asencion Gowda, NP 04/03/2020 3:00 PM

## 2020-04-03 NOTE — Progress Notes (Signed)
  Diagnosis: COVID-19  Physician: Dr. Joya Gaskins  Procedure: [facility] Med: bamlanivimab\etesevimab infusion - Provided patient with bamlanimivab\etesevimab fact sheet for patients, parents and caregivers prior to infusion.  Complications: No immediate complications noted.  Discharge: Discharged home   Tia Masker 04/03/2020

## 2020-04-03 NOTE — Discharge Instructions (Signed)

## 2020-05-18 ENCOUNTER — Encounter: Payer: 59 | Admitting: Adult Health

## 2020-10-27 NOTE — Progress Notes (Addendum)
Complete Physical  Assessment and Plan:  Timothy Newman was seen today for annual exam.  Diagnoses and all orders for this visit:  Encounter for routine adult health examination with abnormal findings Health Maintenance reviewed and updated as needed Advised colonoscopy due, referral placed  Obesity (BMI 30.0-34.9) Long discussion about weight loss, diet, and exercise Recommended diet heavy in fruits and veggies and low in animal meats, cheeses, and dairy products, appropriate calorie intake Patient working on cutting down on sodas, working out Discussed appropriate weight for height  and initial goal (215lb)  Follow up at next visit  Medication management -     CBC with Differential/Platelet -     COMPLETE METABOLIC PANEL WITH GFR -     Urinalysis w microscopic + reflex cultur -     Magnesium  Hyperlipidemia Currently managed by lifestyle LDL goal 100 Continue low cholesterol diet and exercise.  Reduced saturated/trans fats, reduced animal fats, increase fiber Check lipid panel.  -     Lipid panel -     TSH  Screening for diabetes mellitus -     Defer A1C; high deductible plan; had normal in 2019, no family history, working on lifestyle/weight reduction, check CMP for serum glucose  Vitamin D deficiency Continue supplement for goal 60-100 -     VITAMIN D 25 Hydroxy (Vit-D Deficiency, Fractures)  Kidney stones Several remaining per CT in ED reviewed with patient Push water intake [facility] follows Discussed and given script flomax to hold PRN   Umbilical hernia Mild, stable, non-tender; reviewed s/s to monitor with ED precautions  Family hx of AAA - [place] screening done in office 10/28/2020, negative - non smoker, BP well controlled -    Family history malignant melanoma No highly concerning lesions noted today; patient is requesting derm referral to establish care and for full body skin check. Referral placed.   Left hip pain With significantly reduced ROM suspect  arthritis Check Xray, plan ortho referral   Bil foot rash - ciclopirox 0.77% cream, BID, 2-4 weeks - follow up if not improving/resolving -hygiene reviewed    Orders Placed This Encounter  Procedures  . XR HIP UNILAT W OR W/O PELVIS 2-3 VIEWS LEFT  . CBC with Differential/Platelet  . COMPLETE METABOLIC PANEL WITH GFR  . Lipid panel  . TSH  . VITAMIN D 25 Hydroxy (Vit-D Deficiency, Fractures)  . Urinalysis, Routine w reflex microscopic  . Ambulatory referral to Gastroenterology  . Ambulatory referral to Dermatology  . [place], RETROPERITNL ABD,  LTD  . EKG 12-Lead    Discussed med's effects and SE's. Screening labs and tests as requested with regular follow-up as recommended. Over 40 minutes of exam, counseling, chart review and critical decision making was performed  Future Appointments  Date Time Provider [place]  10/31/2021  3:00 PM Liane Comber, NP [facility] None     HPI 50 y.o. male patient presents for a complete physical. He has Umbilical hernia - 4mm; Obesity (BMI 30.0-34.9); Kidney stones; and Hyperlipidemia on their problem list.   He was previously seeing Dr. Conley Canal upstairs, moved care here in 2019 as recommended by his wife, Timothy Newman.   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     He reports over 1 year of left hip tightness and reduced ROM, pain when lying on that side, throbbing; has sense of givng out occasionally, hasn't taken meds but requesting imaging and   Hx of  kidney stones; he presented to ED 03/03/2019 with flank pain and gross hematuria and was found to have non-obstructing 4 mm UPJ stone which he passed spontaneously without complication. CT at that time also showed numerous other small stones bilaterally. Established with [facility] for PRN follow up.   BMI is Body mass index is 32.46 kg/m., he has been working on diet and exercise, down from 269lb.  Quit sodas. He  drinks 2-3 cups of coffee daily but admits too much creamer, too much.  Drinks ~1 gallon water daily.  Sleeps 7 hours most days.  Has been building a house, busy, no longer going to the Gym.  He does intermittent fasting, skips breakfast.  Typically has protein + veggies for dinner, lunch varies.  Eats 2-3 servings of red meat weekly.  Wt Readings from Last 3 Encounters:  10/28/20 246 lb (111.6 kg)  04/01/20 235 lb (106.6 kg)  10/27/19 230 lb 3.2 oz (104.4 kg)   His blood pressure has been controlled at home, today their BP is BP: 124/80 He does workout. He denies chest pain, shortness of breath, dizziness.    He is not on cholesterol medication and denies myalgias. His cholesterol is not at goal. The cholesterol last visit was:   Lab Results  Component Value Date   CHOL 186 10/27/2019   HDL 44 10/27/2019   LDLCALC 119 (H) 10/27/2019   TRIG 119 10/27/2019   CHOLHDL 4.2 10/27/2019    He has been working on diet and exercise for glucose management, and denies increased appetite, nausea, paresthesia of the feet, polydipsia, polyuria and visual disturbances. Last A1C in the office was:  Lab Results  Component Value Date   HGBA1C 5.1 05/14/2018    Last GFR:  Lab Results  Component Value Date   GFRNONAA 74 10/27/2019   Patient is on Vitamin D supplement, taking 5000 IU daily.   Lab Results  Component Value Date   VD25OH 37 10/27/2019       Current Medications:  Current Outpatient Medications on File Prior to Visit  Medication Sig Dispense Refill  . Cholecalciferol (VITAMIN D) 125 MCG (5000 UT) CAPS Take 1 capsule by mouth daily.    . Multiple Vitamin (MULTIVITAMIN) tablet Take 1 tablet by mouth daily.    Marland Kitchen OVER THE COUNTER MEDICATION Airborne gummy 2 per day for Vitamin C    . OVER THE COUNTER MEDICATION Vitamin B12 1 gummy daily.    Marland Kitchen albuterol (VENTOLIN HFA) 108 (90 Base) MCG/ACT inhaler Inhale 2 puffs into the lungs every 6 (six) hours as needed for wheezing or shortness  of breath. (Patient not taking: Reported on 10/28/2020) 8 g 0  . azithromycin (ZITHROMAX) 250 MG tablet 2 po day one then one po days 2-5 6 each 0   No current facility-administered medications on file prior to visit.   Allergies:  No Known Allergies Health Maintenance:   There is no immunization history on file for this patient.  Tetanus: 2016 Pneumovax: -  Flu vaccine: declines Shingrix: declines Covid 19: declines   Colonoscopy: will refer to GI  EGD: -  Eye Exam: Dr. Truman Hayward at [facility] on [address], last visit 2018, wears glasses with computer, plans to schedule this year Dentist: Dr. Radford Pax & [facility], last visit 2022, goes q31m  Derm: Referred to derm last year but never heard back, family history of melanoma, requests another referral   Patient Care Team: Marge Duncans, Hershal Coria as PCP - General (Physician Assistant)  Medical History:  has Umbilical hernia - 28mm; Obesity (BMI 30.0-34.9); Kidney stones; and Hyperlipidemia on their problem list. Surgical History:  He  has a past surgical history that includes Incision and drainage abscess / hematoma of bursa / knee / thigh (Right, ?2004) and Wisdom tooth extraction (1985). Family History:  His family history includes AAA (abdominal aortic aneurysm) in his maternal grandmother; Asthma in his mother; Cancer in his paternal grandmother; Fibromyalgia in his mother; Heart failure (age of onset: 49) in his paternal grandfather; Hyperlipidemia in his mother; Lung cancer in his maternal grandfather; Melanoma in his father. Social History:   reports that he quit smoking about 28 years ago. His smoking use included cigarettes. He has a 3.00 pack-year smoking history. He quit smokeless tobacco use about 5 years ago.  His smokeless tobacco use included snuff. He reports that he does not drink alcohol and does not use drugs.  Review of Systems:  Review of Systems  Constitutional: Negative for malaise/fatigue and weight loss.  HENT: Negative for hearing  loss and tinnitus.   Eyes: Negative for blurred vision and double vision.  Respiratory: Negative for cough, sputum production, shortness of breath and wheezing.   Cardiovascular: Negative for chest pain, palpitations, orthopnea, claudication, leg swelling and PND.  Gastrointestinal: Negative for abdominal pain, blood in stool, constipation, diarrhea, heartburn, melena, nausea and vomiting.  Genitourinary: Negative.   Musculoskeletal: Positive for joint pain (left hip, 12+ months ). Negative for falls and myalgias.  Skin: Positive for rash (bil feet).  Neurological: Negative for dizziness, tingling, sensory change, weakness and headaches.  Endo/Heme/Allergies: Negative for polydipsia.  Psychiatric/Behavioral: Negative.  Negative for depression, memory loss, substance abuse and suicidal ideas. The patient is not nervous/anxious and does not have insomnia.   All other systems reviewed and are negative.   Physical Exam: Estimated body mass index is 32.46 kg/m as calculated from the following:   Height as of this encounter: 6\' 1"  (1.854 m).   Weight as of this encounter: 246 lb (111.6 kg). BP 124/80   Pulse 81   Temp (!) 97.3 F (36.3 C)   Ht 6\' 1"  (1.854 m)   Wt 246 lb (111.6 kg)   SpO2 97%   BMI 32.46 kg/m  General Appearance: Well nourished, well dressed adults in no apparent distress.  Eyes: PERRLA, EOMs, conjunctiva no swelling or erythema, normal fundi and vessels.  Sinuses: No Frontal/maxillary tenderness  ENT/Mouth: Ext aud canals clear, normal light reflex with TMs without erythema, bulging. Good dentition. No erythema, swelling, or exudate on post pharynx. Tonsils not swollen or erythematous. Hearing normal.  Neck: Supple, thyroid normal. No bruits  Respiratory: Respiratory effort normal, BS equal bilaterally without rales, rhonchi, wheezing or stridor.  Cardio: RRR without murmurs, rubs or gallops. Brisk peripheral pulses without edema.  Chest: symmetric, with normal  excursions and percussion.  Abdomen: Soft, nontender, no guarding, rebound, he has nontender very small umbilical hernia with bearing down, no palpable masses or organomegaly.  Lymphatics: Non tender without lymphadenopathy.  Genitourinary: Declines, doing regular self checks, no concerns Musculoskeletal: Full ROM all peripheral extremities, except L hip reduced internal and external rotation without pain, crepitus, popping, clicking; 5/5 strength, and normal gait.  Skin: Warm, dry without concerning lesions (numerous small benign appearing), ecchymosis. He has scattered erythematous rash to bil feet dorsal and interdigital.  Neuro: Cranial nerves intact, reflexes equal bilaterally. Normal muscle tone, no cerebellar symptoms. Sensation intact.  Psych: Awake and oriented X 3, normal affect, Insight and Judgment appropriate.  EKG: WNL, sinus rhythm   AAA [place]: <3cm, negative 10/27/2020  Izora Ribas 5:05 PM [facility]

## 2020-10-28 ENCOUNTER — Encounter: Payer: Self-pay | Admitting: Adult Health

## 2020-10-28 ENCOUNTER — Ambulatory Visit (INDEPENDENT_AMBULATORY_CARE_PROVIDER_SITE_OTHER): Payer: 59 | Admitting: Adult Health

## 2020-10-28 ENCOUNTER — Other Ambulatory Visit: Payer: Self-pay

## 2020-10-28 VITALS — BP 124/80 | HR 81 | Temp 97.3°F | Ht 73.0 in | Wt 246.0 lb

## 2020-10-28 DIAGNOSIS — R030 Elevated blood-pressure reading, without diagnosis of hypertension: Secondary | ICD-10-CM | POA: Diagnosis not present

## 2020-10-28 DIAGNOSIS — Z1329 Encounter for screening for other suspected endocrine disorder: Secondary | ICD-10-CM

## 2020-10-28 DIAGNOSIS — Z1389 Encounter for screening for other disorder: Secondary | ICD-10-CM

## 2020-10-28 DIAGNOSIS — E669 Obesity, unspecified: Secondary | ICD-10-CM

## 2020-10-28 DIAGNOSIS — E66811 Obesity, class 1: Secondary | ICD-10-CM

## 2020-10-28 DIAGNOSIS — Z136 Encounter for screening for cardiovascular disorders: Secondary | ICD-10-CM | POA: Diagnosis not present

## 2020-10-28 DIAGNOSIS — B353 Tinea pedis: Secondary | ICD-10-CM

## 2020-10-28 DIAGNOSIS — Z1283 Encounter for screening for malignant neoplasm of skin: Secondary | ICD-10-CM

## 2020-10-28 DIAGNOSIS — N200 Calculus of kidney: Secondary | ICD-10-CM

## 2020-10-28 DIAGNOSIS — Z0000 Encounter for general adult medical examination without abnormal findings: Secondary | ICD-10-CM | POA: Diagnosis not present

## 2020-10-28 DIAGNOSIS — Z1211 Encounter for screening for malignant neoplasm of colon: Secondary | ICD-10-CM

## 2020-10-28 DIAGNOSIS — E559 Vitamin D deficiency, unspecified: Secondary | ICD-10-CM

## 2020-10-28 DIAGNOSIS — E785 Hyperlipidemia, unspecified: Secondary | ICD-10-CM

## 2020-10-28 DIAGNOSIS — K429 Umbilical hernia without obstruction or gangrene: Secondary | ICD-10-CM

## 2020-10-28 DIAGNOSIS — G8929 Other chronic pain: Secondary | ICD-10-CM

## 2020-10-28 DIAGNOSIS — Z8249 Family history of ischemic heart disease and other diseases of the circulatory system: Secondary | ICD-10-CM

## 2020-10-28 MED ORDER — TAMSULOSIN HCL 0.4 MG PO CAPS: ORAL_CAPSULE | ORAL | 0 refills | Status: DC

## 2020-10-28 NOTE — Patient Instructions (Addendum)
[name] , Thank you for taking time to come for your Annual Wellness Visit. I appreciate your ongoing commitment to your health goals. Please review the following plan we discussed and let me know if I can assist you in the future.   These are the goals we discussed: Goals    . DIET - EAT MORE FRUITS AND VEGETABLES     7+ servings (1/2 cup) daily, limit red meat intake to 6 oz/week    . Weight (lb) < 215 lb (97.5 kg)       This is a list of the screening recommended for you and due dates:  Health Maintenance  Topic Date Due  . Colon Cancer Screening  Never done  . COVID-19 Vaccine (1) 11/13/2020*  . Flu Shot  01/24/2021  . Tetanus Vaccine  06/26/2024  . HIV Screening  Completed  . HPV Vaccine  Aged Out  .  Hepatitis C: One time screening is recommended by Center for Disease Control  (CDC) for  adults born from [birth year] through 1966.   Discontinued  *Topic was postponed. The date shown is not the original due date.    Can walk in to get hip xray at [address] imaging center   Know what a healthy weight is for you (roughly BMI <25) and aim to maintain this  Aim for 7+ servings of fruits and vegetables daily  65-80+ fluid ounces of water or unsweet tea for healthy kidneys  Limit to max 1 drink of alcohol per day; avoid smoking/tobacco  Limit animal fats in diet for cholesterol and heart health - choose grass fed whenever available  Avoid highly processed foods, and foods high in saturated/trans fats  Aim for low stress - take time to unwind and care for your mental health  Aim for 150 min of moderate intensity exercise weekly for heart health, and weights twice weekly for bone health  Aim for 7-9 hours of sleep daily        High-Fiber Eating Plan Fiber, also called dietary fiber, is a type of carbohydrate. It is found foods such as fruits, vegetables, whole grains, and beans. A high-fiber diet can have many health benefits. Your health care provider may  recommend a high-fiber diet to help:  Prevent constipation. Fiber can make your bowel movements more regular.  Lower your cholesterol.  Relieve the following conditions: ? Inflammation of veins in the anus (hemorrhoids). ? Inflammation of specific areas of the digestive tract (uncomplicated diverticulosis). ? A problem of the large intestine, also called the colon, that sometimes causes pain and diarrhea (irritable bowel syndrome, or IBS).  Prevent overeating as part of a weight-loss plan.  Prevent heart disease, type 2 diabetes, and certain cancers. What are tips for following this plan? Reading food labels  Check the nutrition facts label on food products for the amount of dietary fiber. Choose foods that have 5 grams of fiber or more per serving.  The goals for recommended daily fiber intake include: ? Men (age 68 or younger): 34-38 g. ? Men (over age 60): 28-34 g. ? Women (age 31 or younger): 25-28 g. ? Women (over age 26): 22-25 g. Your daily fiber goal is _____________ g.   Shopping  Choose whole fruits and vegetables instead of processed forms, such as apple juice or applesauce.  Choose a wide variety of high-fiber foods such as avocados, lentils, oats, and kidney beans.  Read the nutrition facts label of the foods you choose. Be aware  of foods with added fiber. These foods often have high sugar and sodium amounts per serving. Cooking  Use whole-grain flour for baking and cooking.  Cook with brown rice instead of white rice. Meal planning  Start the day with a breakfast that is high in fiber, such as a cereal that contains 5 g of fiber or more per serving.  Eat breads and cereals that are made with whole-grain flour instead of refined flour or white flour.  Eat brown rice, bulgur wheat, or millet instead of white rice.  Use beans in place of meat in soups, salads, and pasta dishes.  Be sure that half of the grains you eat each day are whole grains. General  information  You can get the recommended daily intake of dietary fiber by: ? Eating a variety of fruits, vegetables, grains, nuts, and beans. ? Taking a fiber supplement if you are not able to take in enough fiber in your diet. It is better to get fiber through food than from a supplement.  Gradually increase how much fiber you consume. If you increase your intake of dietary fiber too quickly, you may have bloating, cramping, or gas.  Drink plenty of water to help you digest fiber.  Choose high-fiber snacks, such as berries, raw vegetables, nuts, and popcorn. What foods should I eat? Fruits Berries. Pears. Apples. Oranges. Avocado. Prunes and raisins. Dried figs. Vegetables Sweet potatoes. Spinach. Kale. Artichokes. Cabbage. Broccoli. Cauliflower. Green peas. Carrots. Squash. Grains Whole-grain breads. Multigrain cereal. Oats and oatmeal. Brown rice. Barley. Bulgur wheat. [place]. Quinoa. Bran muffins. Popcorn. Rye wafer crackers. Meats and other proteins Navy beans, kidney beans, and pinto beans. Soybeans. Split peas. Lentils. Nuts and seeds. Dairy Fiber-fortified yogurt. Beverages Fiber-fortified soy milk. Fiber-fortified orange juice. Other foods Fiber bars. The items listed above may not be a complete list of recommended foods and beverages. Contact a dietitian for more information. What foods should I avoid? Fruits Fruit juice. Cooked, strained fruit. Vegetables Fried potatoes. Canned vegetables. Well-cooked vegetables. Grains White bread. Pasta made with refined flour. White rice. Meats and other proteins Fatty cuts of meat. Fried chicken or fried fish. Dairy Milk. Yogurt. Cream cheese. Sour cream. Fats and oils Butters. Beverages Soft drinks. Other foods Cakes and pastries. The items listed above may not be a complete list of foods and beverages to avoid. Talk with your dietitian about what choices are best for you. Summary  Fiber is a type of carbohydrate. It is  found in foods such as fruits, vegetables, whole grains, and beans.  A high-fiber diet has many benefits. It can help to prevent constipation, lower blood cholesterol, aid weight loss, and reduce your risk of heart disease, diabetes, and certain cancers.  Increase your intake of fiber gradually. Increasing fiber too quickly may cause cramping, bloating, and gas. Drink plenty of water while you increase the amount of fiber you consume.  The best sources of fiber include whole fruits and vegetables, whole grains, nuts, seeds, and beans. This information is not intended to replace advice given to you by your health care provider. Make sure you discuss any questions you have with your health care provider. Document Revised: 10/16/2019 Document Reviewed: 10/16/2019 Elsevier Patient Education  2021 [employer].

## 2020-10-28 NOTE — Progress Notes (Signed)
Aorta scan complete and results were within the normal limits, <3cm. Provider aware and signed off.

## 2020-10-29 LAB — URINALYSIS, ROUTINE W REFLEX MICROSCOPIC
Bilirubin Urine: NEGATIVE
Glucose, UA: NEGATIVE
Hgb urine dipstick: NEGATIVE
Ketones, ur: NEGATIVE
Leukocytes,Ua: NEGATIVE
Nitrite: NEGATIVE
Protein, ur: NEGATIVE
Specific Gravity, Urine: 1.026 (ref 1.001–1.035)
pH: 6.5 (ref 5.0–8.0)

## 2020-10-29 LAB — CBC WITH DIFFERENTIAL/PLATELET
Absolute Monocytes: 968 cells/uL — ABNORMAL HIGH (ref 200–950)
Basophils Absolute: 48 cells/uL (ref 0–200)
Basophils Relative: 0.6 %
Eosinophils Absolute: 272 cells/uL (ref 15–500)
Eosinophils Relative: 3.4 %
HCT: 45 % (ref 38.5–50.0)
Hemoglobin: 15 g/dL (ref 13.2–17.1)
Lymphs Abs: 2568 cells/uL (ref 850–3900)
MCH: 31.4 pg (ref 27.0–33.0)
MCHC: 33.3 g/dL (ref 32.0–36.0)
MCV: 94.1 fL (ref 80.0–100.0)
MPV: 10 fL (ref 7.5–12.5)
Monocytes Relative: 12.1 %
Neutro Abs: 4144 cells/uL (ref 1500–7800)
Neutrophils Relative %: 51.8 %
Platelets: 270 10*3/uL (ref 140–400)
RBC: 4.78 10*6/uL (ref 4.20–5.80)
RDW: 11.8 % (ref 11.0–15.0)
Total Lymphocyte: 32.1 %
WBC: 8 10*3/uL (ref 3.8–10.8)

## 2020-10-29 LAB — COMPLETE METABOLIC PANEL WITH GFR
AG Ratio: 1.9 (calc) (ref 1.0–2.5)
ALT: 20 U/L (ref 9–46)
AST: 16 U/L (ref 10–35)
Albumin: 4.5 g/dL (ref 3.6–5.1)
Alkaline phosphatase (APISO): 46 U/L (ref 35–144)
BUN: 22 mg/dL (ref 7–25)
CO2: 29 mmol/L (ref 20–32)
Calcium: 9.6 mg/dL (ref 8.6–10.3)
Chloride: 104 mmol/L (ref 98–110)
Creat: 1.32 mg/dL (ref 0.70–1.33)
GFR, Est African American: 72 mL/min/{1.73_m2} (ref >=60)
GFR, Est Non African American: 62 mL/min/{1.73_m2} (ref >=60)
Globulin: 2.4 g/dL (calc) (ref 1.9–3.7)
Glucose, Bld: 91 mg/dL (ref 65–99)
Potassium: 4.3 mmol/L (ref 3.5–5.3)
Sodium: 140 mmol/L (ref 135–146)
Total Bilirubin: 0.6 mg/dL (ref 0.2–1.2)
Total Protein: 6.9 g/dL (ref 6.1–8.1)

## 2020-10-29 LAB — VITAMIN D 25 HYDROXY (VIT D DEFICIENCY, FRACTURES): Vit D, 25-Hydroxy: 96 ng/mL (ref 30–100)

## 2020-10-29 LAB — TSH: TSH: 1.18 mIU/L (ref 0.40–4.50)

## 2020-10-29 LAB — LIPID PANEL
Cholesterol: 200 mg/dL — ABNORMAL HIGH (ref <200)
HDL: 44 mg/dL (ref >=40)
LDL Cholesterol (Calc): 127 mg/dL (calc) — ABNORMAL HIGH
Non-HDL Cholesterol (Calc): 156 mg/dL (calc) — ABNORMAL HIGH (ref <130)
Total CHOL/HDL Ratio: 4.5 (calc) (ref <5.0)
Triglycerides: 176 mg/dL — ABNORMAL HIGH (ref <150)

## 2020-10-29 MED ORDER — CICLOPIROX OLAMINE 0.77 % EX CREA: TOPICAL_CREAM | Freq: Two times a day (BID) | CUTANEOUS | 0 refills | Status: DC

## 2020-10-29 NOTE — Addendum Note (Signed)
Addended by: Izora Ribas on: 10/29/2020 07:26 AM   Modules accepted: Orders

## 2020-11-01 ENCOUNTER — Other Ambulatory Visit: Payer: Self-pay

## 2020-11-01 ENCOUNTER — Ambulatory Visit
Admission: RE | Admit: 2020-11-01 | Discharge: 2020-11-01 | Disposition: A | Discharge status: Home / Self Care | Payer: 59 | Source: Ambulatory Visit | Attending: Adult Health | Admitting: Adult Health

## 2020-11-03 ENCOUNTER — Other Ambulatory Visit: Payer: Self-pay | Admitting: Adult Health

## 2020-11-03 DIAGNOSIS — M25552 Pain in left hip: Secondary | ICD-10-CM

## 2020-11-03 DIAGNOSIS — M1612 Unilateral primary osteoarthritis, left hip: Secondary | ICD-10-CM | POA: Insufficient documentation

## 2020-11-03 HISTORY — DX: Unilateral primary osteoarthritis, left hip: M16.12

## 2020-11-15 ENCOUNTER — Encounter: Payer: Self-pay | Admitting: Physician Assistant

## 2020-11-15 ENCOUNTER — Ambulatory Visit (INDEPENDENT_AMBULATORY_CARE_PROVIDER_SITE_OTHER): Payer: 59 | Admitting: Physician Assistant

## 2020-11-15 ENCOUNTER — Ambulatory Visit: Payer: Self-pay

## 2020-11-15 DIAGNOSIS — M1612 Unilateral primary osteoarthritis, left hip: Secondary | ICD-10-CM | POA: Diagnosis not present

## 2020-11-15 NOTE — Progress Notes (Signed)
Subjective: Patient is here for ultrasound-guided intra-articular left hip injection.   Pain and stiffness.  Objective:  Decreased ROM with IR.  Procedure: Ultrasound guided injection is preferred based studies that show increased duration, increased effect, greater accuracy, decreased procedural pain, increased response rate, and decreased cost with ultrasound guided versus blind injection.   Verbal informed consent obtained.  Time-out conducted.  Noted no overlying erythema, induration, or other signs of local infection. Ultrasound-guided left hip injection: After sterile prep with Betadine, injected 4 cc 0.25% bupivacaine without epinephrine and 6 mg betamethasone using a 22-gauge spinal needle, passing the needle through the iliofemoral ligament into the femoral head/neck junction.  Injectate seen filling joint capsule.

## 2020-11-15 NOTE — Progress Notes (Signed)
Office Visit Note   Patient: Timothy Newman           Date of Birth: 10-Dec-1970           MRN: [id number] Visit Date: 11/15/2020              Requested by: Liane Comber, NP [address] [place],  [place] [address] PCP: Unk Pinto, MD   Assessment & Plan: Visit Diagnoses:  1. Arthritis of left hip     Plan: We will set him up for an intra-articular injection left hip.  He would like to follow-up sometime in the fall to discuss possible surgery light in the near.  Surgery will be required to the left total hip arthroplasty.  Risk benefits discussed with him today.  Postoperative protocol discussed with him.  Handout on hip replacement in the components of a hip replacement was shown doing.  Questions were encouraged and answered at length.  At his next follow-up would recommend an AP pelvis and lateral view of the left hip at that time.  Follow-Up Instructions: Return if symptoms worsen or fail to improve.   Orders:  No orders of the defined types were placed in this encounter.  No orders of the defined types were placed in this encounter.     Procedures: No procedures performed   Clinical Data: No additional findings.   Subjective: Chief Complaint  Patient presents with  . Left Hip - Pain    HPI [name] is a 50 year old male comes in today with left hip pain that is been ongoing for over a year.  He states that his range of motion is diminishing in the left hip.  He has trouble donning shoes and socks.  He has tried stretching.  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  Hip does feel like it might give way.  No known injury.  Pain is mostly lateral aspect of the hip. Radiographs AP hemipelvis and lateral left hip on epic are reviewed and shows near end-stage arthritis left hip.  Periarticular spurring is noted.  Elongation and loss of spherical shape of the femoral head noted.  No acute fractures.  Review of Systems Negative for fevers  or chills.  No recent vaccines  Objective: Vital Signs: There were no vitals taken for this visit.  Physical Exam Constitutional:      Appearance: He is not ill-appearing or diaphoretic.  Pulmonary:     Effort: Pulmonary effort is normal.  Neurological:     Mental Status: He is alert and oriented to person, place, and time.  Psychiatric:        Mood and Affect: Mood normal.     Ortho Exam Left hip he has discomfort with internal and external rotation no internal rotation limited external rotation left hip.  Slightly limited internal rotation of the right hip good external rotation without pain.  Dorsiflexion plantarflexion bilateral ankles intact. Specialty Comments:  No specialty comments available.  Imaging: No results found.   PMFS History: Patient Active Problem List   Diagnosis Date Noted  . Arthritis of left hip 11/03/2020  . Chronic hip pain, left 10/28/2020  . Kidney stones 05/16/2019  . Hyperlipidemia 05/16/2019  . Obesity (BMI 30.0-34.9) 05/14/2018  . Umbilical hernia - 22mm 19/14/7829   Past Medical History:  Diagnosis Date  . Family history of abdominal aortic aneurysm (AAA) 05/16/2019  . Inflamed sebaceous cyst s/p excision Nov 2014 05/12/2013  . MRSA cellulitis ?2004   I&D in  ED.  IV Vanco w PICC  . Umbilical hernia - 19mm 70/96/2836    Family History  Problem Relation Age of Onset  . Asthma Mother   . Fibromyalgia Mother   . Hyperlipidemia Mother   . Melanoma Father        with brain mets  . AAA (abdominal aortic aneurysm) Maternal Grandmother   . Lung cancer Maternal Grandfather        worked in Manufacturing engineer  . Cancer Paternal Grandmother   . Heart failure Paternal Grandfather 90    Past Surgical History:  Procedure Laterality Date  . INCISION AND DRAINAGE ABSCESS / HEMATOMA OF BURSA / KNEE / THIGH Right ?2004   ?Dr Viona GilmoreDeon Pilling   . [place] EXTRACTION  1985   Social History   Occupational History  . Not on file  Tobacco Use  .  Smoking status: Former Smoker    Packs/day: 1.00    Years: 3.00    Pack years: 3.00    Types: Cigarettes    Quit date: 05/14/1992    Years since quitting: 28.5  . Smokeless tobacco: Former Systems developer    Types: Snuff    Quit date: 05/15/2015  Vaping Use  . Vaping Use: Never used  Substance and Sexual Activity  . Alcohol use: No  . Drug use: No  . Sexual activity: Yes    Partners: Female    Birth control/protection: Surgical    Comment: wife s/p hysterectomy

## 2021-01-12 NOTE — Progress Notes (Deleted)
Assessment and Plan:  There are no diagnoses linked to this encounter.    Further disposition pending results of labs. Discussed med's effects and SE's.   Over 30 minutes of exam, counseling, chart review, and critical decision making was performed.   Future Appointments  Date Time Provider [place]  01/13/2021  2:00 PM Magda Bernheim, NP [facility] None  03/14/2021  8:30 AM Pete Pelt, PA-C [facility] None  10/31/2021  3:00 PM Liane Comber, NP [facility] None    ------------------------------------------------------------------------------------------------------------------   HPI There were no vitals taken for this visit. 50 y.o.male presents for   Past Medical History:  Diagnosis Date   Family history of abdominal aortic aneurysm (AAA) 05/16/2019   Inflamed sebaceous cyst s/p excision Nov 2014 05/12/2013   MRSA cellulitis ?2004   I&D in ED.  IV Vanco w PICC   Umbilical hernia - 33mm 19/62/2297     No Known Allergies  Current Outpatient Medications on File Prior to Visit  Medication Sig   albuterol (VENTOLIN HFA) 108 (90 Base) MCG/ACT inhaler Inhale 2 puffs into the lungs every 6 (six) hours as needed for wheezing or shortness of breath. (Patient not taking: Reported on 10/28/2020)   Cholecalciferol (VITAMIN D) 125 MCG (5000 UT) CAPS Take 1 capsule by mouth daily.   ciclopirox (LOPROX) 0.77 % cream Apply topically 2 (two) times daily. For skin rash, 2-4 weeks.   Multiple Vitamin (MULTIVITAMIN) tablet Take 1 tablet by mouth daily.   OVER THE COUNTER MEDICATION Airborne gummy 2 per day for Vitamin C   OVER THE COUNTER MEDICATION Vitamin B12 1 gummy daily.   tamsulosin (FLOMAX) 0.4 MG CAPS capsule Take 1 cap as needed with onset of kidney stone pain.   No current facility-administered medications on file prior to visit.    ROS: all negative except above.   Physical Exam:  There were no vitals taken for this visit.  General Appearance: Well nourished, in no  apparent distress. Eyes: PERRLA, EOMs, conjunctiva no swelling or erythema Sinuses: No Frontal/maxillary tenderness ENT/Mouth: Ext aud canals clear, TMs without erythema, bulging. No erythema, swelling, or exudate on post pharynx.  Tonsils not swollen or erythematous. Hearing normal.  Neck: Supple, thyroid normal.  Respiratory: Respiratory effort normal, BS equal bilaterally without rales, rhonchi, wheezing or stridor.  Cardio: RRR with no MRGs. Brisk peripheral pulses without edema.  Abdomen: Soft, + BS.  Non tender, no guarding, rebound, hernias, masses. Lymphatics: Non tender without lymphadenopathy.  Musculoskeletal: Full ROM, 5/5 strength, normal gait.  Skin: Warm, dry without rashes, lesions, ecchymosis.  Neuro: Cranial nerves intact. Normal muscle tone, no cerebellar symptoms. Sensation intact.  Psych: Awake and oriented X 3, normal affect, Insight and Judgment appropriate.     Magda Bernheim, NP 1:39 PM [facility]

## 2021-01-13 ENCOUNTER — Other Ambulatory Visit: Payer: Self-pay

## 2021-01-13 ENCOUNTER — Encounter: Payer: Self-pay | Admitting: Internal Medicine

## 2021-01-13 ENCOUNTER — Ambulatory Visit (INDEPENDENT_AMBULATORY_CARE_PROVIDER_SITE_OTHER): Payer: 59 | Admitting: Internal Medicine

## 2021-01-13 ENCOUNTER — Ambulatory Visit: Payer: 59 | Admitting: Nurse Practitioner

## 2021-01-13 VITALS — BP 120/78 | HR 75 | Temp 97.1°F | Resp 16 | Ht 71.5 in | Wt 249.0 lb

## 2021-01-13 DIAGNOSIS — K610 Anal abscess: Secondary | ICD-10-CM

## 2021-01-13 DIAGNOSIS — K604 Rectal fistula: Secondary | ICD-10-CM | POA: Diagnosis not present

## 2021-01-13 MED ORDER — CEPHALEXIN 500 MG PO CAPS: ORAL_CAPSULE | ORAL | 1 refills | Status: DC

## 2021-01-13 NOTE — Progress Notes (Signed)
    Future Appointments  Date Time Provider [place]  01/13/2021  2:00 PM Unk Pinto, MD [facility] None  03/14/2021  8:30 AM Pete Pelt, PA-C [facility] None  10/31/2021  3:00 PM Liane Comber, NP [facility] None    History of Present Illness:      Medications    VITAMIN D 5000 u , Take 1 capsule  daily.   ciclopirox (LOPROX) 0.77 % cream, Apply topically 2 (two) times daily. For skin rash, 2-4 weeks.   Multiple Vitamin  Take 1 tablet  daily.    Airborne gummy 2 per day for Vitamin C   Vitamin B12 1 gummy daily.   tamsulosin 0.4 MG , Take 1 cap as needed with onset of kidney stone pain.  Problem list He has Umbilical hernia - 68mm; Obesity (BMI 30.0-34.9); Kidney stones; Hyperlipidemia; Chronic hip pain, left; and Arthritis of left hip on their problem list.   Observations/Objective:  BP 120/78   Pulse 75   Temp (!) 97.1 F (36.2 C)   Resp 16   Ht 5' 11.5" (1.816 m)   Wt 249 lb (112.9 kg)   SpO2 99%   BMI 34.24 kg/m   Exam focused peri-rectal  appears to have a superficial open 3 x 3 mm area appears granulated area about 2 " from rectum and appears mildly inflamed w/o drainage or signs of cellulitis.    Assessment and Plan:  1. Perianal abscess  - cephALEXin 500 MG capsule; Take  1 capsule  4 x /day  with Meals & Bedtime  with Food for Infection  Dispense: 60 capsule; Refill: 1  2. Rectal fistula, doubt  - but if not resolve with emperic antibiotic tx then anticipate surgical evaluation   Follow Up Instructions:      I discussed the assessment and treatment plan with the patient. The patient was provided an opportunity to ask questions and all were answered. The patient agreed with the plan and demonstrated an understanding of the instructions.       The patient was advised to call back or seek an in-person evaluation if the symptoms worsen or if the condition fails to improve as anticipated.   Kirtland Bouchard, MD

## 2021-01-13 NOTE — Patient Instructions (Signed)
Anorectal Abscess  An abscess is an infected area that contains a collection of pus. An anorectal abscess is an abscess that is near the opening of the anus or around the rectum. Without treatment, an anorectal abscess can become larger and cause other problems, such as a more serious body-wide infection or pain, especiallyduring bowel movements. What are the causes? This condition is caused by plugged glands or an infection in one of these areas: The anus. The area between the anus and the scrotum in males or between the anus and the vagina in females (perineum). What increases the risk? The following factors may make you more likely to develop this condition: Diabetes or inflammatory bowel disease. Having a body defense system (immune system) that is weak. Engaging in anal sex. Having a sexually transmitted infection (STI). Certain kinds of cancer, such as rectal carcinoma, leukemia, or lymphoma. What are the signs or symptoms? The main symptom of this condition is pain. The pain may be a throbbing pain that gets worse during bowel movements. Other symptoms include: Swelling and redness in the area of the abscess. The redness may go beyond the abscess and appear as a red streak on the skin. A visible, painful lump, or a lump that can be felt when touched. Bleeding or pus-like discharge from the area. Fever. General weakness. Constipation. Diarrhea. How is this diagnosed? This condition is diagnosed based on your medical history and a physical exam of the affected area. This may involve examining the rectal area with a gloved hand (digital rectal exam). Sometimes, the health care provider needs to look into the rectum using a probe, scope, or imaging test. For women, it may require a careful vaginal exam. How is this treated? Treatment for this condition may include: Incision and drainage surgery. This involves making an incision over the abscess to drain the pus. Medicines,  including antibiotic medicine, pain medicine, stool softeners, or laxatives. Follow these instructions at home: Medicines Take over-the-counter and prescription medicines only as told by your health care provider. If you were prescribed an antibiotic medicine, use it as told by your health care provider. Do not stop using the antibiotic even if you start to feel better. Do not drive or use heavy machinery while taking prescription pain medicine. Wound care  If gauze was used in the abscess, follow instructions from your health care provider about removing or changing the gauze. It can usually be removed in 2-3 days. Wash your hands with soap and water before you remove or change your gauze. If soap and water are not available, use hand sanitizer. If one or more drains were placed in the abscess cavity, be careful not to pull at them. Your health care provider will tell you how long they need to remain in place. Check your incision area every day for signs of infection. Check for: More redness, swelling, or pain. More fluid or blood. Warmth. Pus or a bad smell.  Managing pain, stiffness, and swelling  Take a sitz bath 3-4 times a day and after bowel movements. This will help reduce pain and swelling. To relieve pain, try sitting: On a heating pad with the setting on low. On an inflatable donut-shaped cushion. If directed, put ice on the affected area: Put ice in a plastic bag. Place a towel between your skin and the bag. Leave the ice on for 20 minutes, 2-3 times a day.  General instructions Follow any diet instructions given by your health care provider. Keep all follow-up visits  as told by your health care provider. This is important. Contact a health care provider if you have: Bleeding from your incision. Pain, swelling, or redness that does not improve or gets worse. Trouble passing stool or urine. Symptoms that return after treatment. Get help right away if you: Have problems  moving or using your legs. Have severe or increasing pain. Have swelling in the affected area that suddenly gets worse. Have a large increase in bleeding or passing of pus. Develop chills or a fever. Summary An anorectal abscess is an abscess that is near the opening of the anus or around the rectum. An abscess is an infected area that contains a collection of pus. The main symptom of this condition is pain. It may be a throbbing pain that gets worse during bowel movements. Treatment for an anorectal abscess may include surgery to drain the pus from the abscess. Medicines and sitz baths may also be a part of your treatment plan.   ++++++++++++++++++++ Anal Fistula  An anal fistula is a hole that develops between the bowel and the skin near the anus. The anus allows stool (feces) to leave the body. The anus has many tiny glands that make lubricating fluid. Sometimes, these glands become plugged and infected. This can cause a fluid-filled pocket (abscess) to form. An anal fistula often occurs when an abscess becomes infected andthen develops into a hole between the bowel and the skin. What are the causes? In most cases, an anal fistula is caused by a past or current buildup of pus around the anus (anal abscess). Other causes include: A complication of surgery. Injury to the rectum or the area around it. Using high-energy beams (radiation) to treat the area around the rectum. What increases the risk? You are more likely to develop this condition if you have certain medical conditions or diseases, including: Chronic inflammatory bowel disease, such as Crohn's disease or ulcerative colitis. Colon cancer or rectal cancer. Diverticular disease, such as diverticulitis. A sexually transmitted infection, or STI, such as gonorrhea, chlamydia, or syphilis. An infection that is caused by HIV. What are the signs or symptoms? Symptoms of this condition include: Throbbing or constant pain that may be  worse while you are sitting. Swelling or irritation around the anus. Pus or blood from an opening near the anus. Pain when passing stool. Fever or chills. How is this diagnosed? This condition is diagnosed based on: A physical exam. This may include: An exam to find the external opening of the fistula. An exam with a probe or scope to help locate the internal opening of the fistula. An exam of the rectum with a gloved hand (digital rectal exam). Imaging tests that use dye to find the exact location and path of the fistula. Tests may include: X-rays. Ultrasound. CT scan. MRI. Other tests to find the cause of the anal fistula. How is this treated? This condition is most commonly treated with surgery. The type of surgery that is used will depend on where the fistula is located and how complex the fistula is. Surgery may include: A fistulotomy. The whole fistula is opened up, and the contents are drained to promote healing. Seton placement. A silk string (seton) is placed into the fistula during a fistulotomy. This helps to drain any infection and promote healing. Advancement flap procedure. Tissue is removed from your rectum or the skin around the anus and attached to the opening of the fistula. Bioprosthetic plug. A cone-shaped plug is made from your tissue and  is used to block the opening of the fistula. Some anal fistulas do not require surgery. A nonsurgical treatment option involves injecting a fibrin glue to seal the fistula. You also may beprescribed an antibiotic medicine to treat any infection. Follow these instructions at home: Medicines Take over-the-counter and prescription medicines only as told by your health care provider. If you were prescribed an antibiotic medicine, take it as told by your health care provider. Do not stop taking the antibiotic even if you start to feel better. Use a stool softener or a laxative if told to do so by your health care provider. General  instructions  Eat a high-fiber diet as told by your health care provider. This can help to prevent constipation. Drink enough fluid to keep your urine pale yellow. Take a warm sitz bath for 15-20 minutes, 3-4 times per day, or as told by your health care provider. Sitz baths can ease your pain and discomfort and help with healing. Follow good hygiene to keep the anal area as clean and dry as possible. Use wet toilet paper or a moist towelette after each bowel movement. Keep all follow-up visits as told by your health care provider. This is important.  Contact a health care provider if you have: Increased pain that is not controlled with medicines. New redness or swelling around the anal area. New fluid, blood, or pus coming from the anal area. Tenderness or warmth around the anal area. Get help right away if you have: A fever. Severe pain. Chills or diarrhea. Severe problems urinating or having a bowel movement. Summary An anal fistula is a hole that develops between the bowel and the skin near the anus. This condition is most often caused by a buildup of pus around the anus (anal abscess). Other causes include a complication of surgery, an injury to the rectum, or the use of radiation to treat the rectal area. This condition is most commonly treated with surgery. Follow your health care provider's instructions about taking medicines, eating and drinking, or taking sitz baths. Call your health care provider if you have more pain, swelling, or blood. Get help right away if you have fever, severe pain, or problems passing urine or stool. This information is not intended to replace advice given to you by your health care provider. Make sure you discuss any questions you have with your healthcare provider. Document Revised: 10/26/2017 Document Reviewed: 10/26/2017 Elsevier Patient Education  [place].

## 2021-03-14 ENCOUNTER — Ambulatory Visit: Payer: Self-pay

## 2021-03-14 ENCOUNTER — Encounter: Payer: Self-pay | Admitting: Physician Assistant

## 2021-03-14 ENCOUNTER — Other Ambulatory Visit: Payer: Self-pay

## 2021-03-14 ENCOUNTER — Ambulatory Visit (INDEPENDENT_AMBULATORY_CARE_PROVIDER_SITE_OTHER): Payer: 59 | Admitting: Physician Assistant

## 2021-03-14 DIAGNOSIS — M1612 Unilateral primary osteoarthritis, left hip: Secondary | ICD-10-CM | POA: Diagnosis not present

## 2021-03-14 NOTE — Progress Notes (Signed)
Office Visit Note   Patient: Timothy Newman           Date of Birth: 12/01/1970           MRN: [id number] Visit Date: 03/14/2021              Requested by: Unk Pinto, MD [address] [place],  [place] [address] PCP: Unk Pinto, MD   Assessment & Plan: Visit Diagnoses:  1. Arthritis of left hip   2. Primary osteoarthritis of left hip     Plan: Send him for repeat injection left hip with Dr. Ernestina Patches.  He will then follow-up with [place] sometime close to the first of the year to possibly discuss left total hip arthroplasty.  Questions encouraged and answered at length.  Follow-Up Instructions: Return in about 4 months (around 07/14/2021).   Orders:  Orders Placed This Encounter  Procedures   XR HIP UNILAT W OR W/O PELVIS 2-3 VIEWS LEFT   No orders of the defined types were placed in this encounter.     Procedures: No procedures performed   Clinical Data: No additional findings.   Subjective: Chief Complaint  Patient presents with   Left Hip - Follow-up    HPI Timothy Newman returns today due to left hip pain.  He underwent a left hip intra-articular injection under ultrasound on 11/15/2020.  He states this helped until July when he very turned to get something and had a pop in the hip.  States pains been ongoing for the last couple of months worse with motion.  Ranks pain to be 2 out of 10 pain at worst.  Been taking Tylenol for pain.  No other injuries or falls.  He feels as if the left hip pain is gradually getting worse.  He does have several work projects going on at this point time and is wanting to proceed with possible repeat injection although he notices early to the performance.  He is nondiabetic.  He denies any recent fevers chills or vaccines. Review of Systems See HPI  Objective: Vital Signs: There were no vitals taken for this visit.  Physical Exam General: Well-developed well-nourished male no acute distress mood affect  appropriate Psych alert and oriented x3 Ortho Exam Ambulates without any assistive devices nonantalgic gait.  Right hip excellent range of motion without pain.  Left hip limited internal and external rotation with pain with internal rotation. Specialty Comments:  No specialty comments available.  Imaging: XR HIP UNILAT W OR W/O PELVIS 2-3 VIEWS LEFT  Result Date: 03/14/2021 AP pelvis lateral view left hip: Both hips well located.  No acute fractures.  Left hip with bone-on-bone arthritic changes basically unchanged from prior films.  No acute findings    PMFS History: Patient Active Problem List   Diagnosis Date Noted   Primary osteoarthritis of left hip 11/03/2020   Chronic hip pain, left 10/28/2020   Kidney stones 05/16/2019   Hyperlipidemia 05/16/2019   Obesity (BMI 30.0-34.9) 0000000   Umbilical hernia - 60m [id number]  Past Medical History:  Diagnosis Date   Family history of abdominal aortic aneurysm (AAA) 05/16/2019   Inflamed sebaceous cyst s/p excision Nov 2014 05/12/2013   MRSA cellulitis ?2004   I&D in ED.  IV Vanco w PICC   Umbilical hernia - [id number]  Family History  Problem Relation Age of Onset   Asthma Mother    Fibromyalgia Mother    Hyperlipidemia Mother    Melanoma Father  with brain mets   AAA (abdominal aortic aneurysm) Maternal Grandmother    Lung cancer Maternal Grandfather        worked in Manufacturing engineer   Cancer Paternal Grandmother    Heart failure Paternal Grandfather 90    Past Surgical History:  Procedure Laterality Date   INCISION AND DRAINAGE ABSCESS / HEMATOMA OF BURSA / KNEE / THIGH Right ?2004   ?Dr Hassell Halim    WISDOM TOOTH EXTRACTION  1985   Social History   Occupational History   Not on file  Tobacco Use   Smoking status: Former    Packs/day: 1.00    Years: 3.00    Pack years: 3.00    Types: Cigarettes    Quit date: 05/14/1992    Years since quitting: 28.8   Smokeless tobacco: Former    Types:  Snuff    Quit date: 05/15/2015  Vaping Use   Vaping Use: Never used  Substance and Sexual Activity   Alcohol use: No   Drug use: No   Sexual activity: Yes    Partners: Female    Birth control/protection: Surgical    Comment: wife s/p hysterectomy

## 2021-03-15 ENCOUNTER — Other Ambulatory Visit: Payer: Self-pay

## 2021-03-15 DIAGNOSIS — M1612 Unilateral primary osteoarthritis, left hip: Secondary | ICD-10-CM

## 2021-03-24 ENCOUNTER — Ambulatory Visit (INDEPENDENT_AMBULATORY_CARE_PROVIDER_SITE_OTHER): Payer: 59 | Admitting: Physical Medicine and Rehabilitation

## 2021-03-24 ENCOUNTER — Ambulatory Visit: Payer: Self-pay

## 2021-03-24 ENCOUNTER — Other Ambulatory Visit: Payer: Self-pay

## 2021-03-24 ENCOUNTER — Encounter: Payer: Self-pay | Admitting: Physical Medicine and Rehabilitation

## 2021-03-24 DIAGNOSIS — M25552 Pain in left hip: Secondary | ICD-10-CM | POA: Diagnosis not present

## 2021-03-24 DIAGNOSIS — G8929 Other chronic pain: Secondary | ICD-10-CM | POA: Diagnosis not present

## 2021-03-24 NOTE — Progress Notes (Signed)
Pt state left hip pain that trip down to his left thigh. Pt state laying down no his left side at night wakes him up in pain. Pt state he take over the counter pain meds to help ease his pain.  Numeric Pain Rating Scale and Functional Assessment Average Pain 3   In the last MONTH (on 0-10 scale) has pain interfered with the following?  1. General activity like being  able to carry out your everyday physical activities such as walking, climbing stairs, carrying groceries, or moving a chair?  Rating(9)   +Driver, -BT, -Dye Allergies.

## 2021-03-24 NOTE — Progress Notes (Signed)
   DEMON VOLANTE - 50 y.o. male MRN [id number]  Date of birth: [birth year]  Office Visit Note: Visit Date: 03/24/2021 PCP: Unk Pinto, MD Referred by: Unk Pinto, MD  Subjective: Chief Complaint  Patient presents with   Left Hip - Pain    HPI:  Timothy Newman is a 50 y.o. male who comes in today at the request of Dr. Jean Rosenthal for planned Left anesthetic hip arthrogram with fluoroscopic guidance.  The patient has failed conservative care including home exercise, medications, time and activity modification.  This injection will be diagnostic and hopefully therapeutic.  Please see requesting physician notes for further details and justification.   ROS Otherwise per HPI.  Assessment & Plan: Visit Diagnoses:    ICD-10-CM   1. Chronic hip pain, left  M25.552 XR C-ARM NO REPORT   G89.29 Large Joint Inj: L hip joint      Plan: No additional findings.   Meds & Orders: No orders of the defined types were placed in this encounter.   Orders Placed This Encounter  Procedures   Large Joint Inj: L hip joint   XR C-ARM NO REPORT    Follow-up: Return for Jean Rosenthal, MD as scheduled.   Procedures: Large Joint Inj: L hip joint on 03/24/2021 8:42 AM Indications: diagnostic evaluation and pain Details: 22 G 3.5 in needle, fluoroscopy-guided anterior approach  Arthrogram: No  Medications: 4 mL bupivacaine 0.25 %; 60 mg triamcinolone acetonide 40 MG/ML Outcome: tolerated well, no immediate complications  There was excellent flow of contrast producing a partial arthrogram of the hip. The patient did have relief of symptoms during the anesthetic phase of the injection. Procedure, treatment alternatives, risks and benefits explained, specific risks discussed. Consent was given by the patient. Immediately prior to procedure a time out was called to verify the correct patient, procedure, equipment, support staff and site/side marked as required. Patient was prepped and  draped in the usual sterile fashion.         Clinical History: No specialty comments available.     Objective:  VS:  HT:    WT:   BMI:     BP:   HR: bpm  TEMP: ( )  RESP:  Physical Exam   Imaging: No results found.

## 2021-03-25 MED ORDER — BUPIVACAINE HCL 0.25 % IJ SOLN
4.0000 mL | INTRAMUSCULAR | Status: AC | PRN
  Administered 2021-03-24: 4 mL via INTRA_ARTICULAR

## 2021-03-25 MED ORDER — TRIAMCINOLONE ACETONIDE 40 MG/ML IJ SUSP
60.0000 mg | INTRAMUSCULAR | Status: AC | PRN
  Administered 2021-03-24: 60 mg via INTRA_ARTICULAR

## 2021-06-11 ENCOUNTER — Emergency Department (HOSPITAL_BASED_OUTPATIENT_CLINIC_OR_DEPARTMENT_OTHER): Payer: 59

## 2021-06-11 ENCOUNTER — Encounter (HOSPITAL_BASED_OUTPATIENT_CLINIC_OR_DEPARTMENT_OTHER): Payer: Self-pay | Admitting: Emergency Medicine

## 2021-06-11 ENCOUNTER — Emergency Department (HOSPITAL_BASED_OUTPATIENT_CLINIC_OR_DEPARTMENT_OTHER)
Admission: EM | Admit: 2021-06-11 | Discharge: 2021-06-11 | Disposition: A | Discharge status: Home / Self Care | Payer: 59 | Attending: Emergency Medicine | Admitting: Emergency Medicine

## 2021-06-11 DIAGNOSIS — Z87891 Personal history of nicotine dependence: Secondary | ICD-10-CM | POA: Diagnosis not present

## 2021-06-11 DIAGNOSIS — R1011 Right upper quadrant pain: Secondary | ICD-10-CM

## 2021-06-11 DIAGNOSIS — N23 Unspecified renal colic: Secondary | ICD-10-CM | POA: Insufficient documentation

## 2021-06-11 DIAGNOSIS — R109 Unspecified abdominal pain: Secondary | ICD-10-CM | POA: Diagnosis present

## 2021-06-11 LAB — URINALYSIS, ROUTINE W REFLEX MICROSCOPIC
Bilirubin Urine: NEGATIVE
Glucose, UA: NEGATIVE mg/dL
Hgb urine dipstick: NEGATIVE
Ketones, ur: NEGATIVE mg/dL
Leukocytes,Ua: NEGATIVE
Nitrite: NEGATIVE
Protein, ur: NEGATIVE mg/dL
Specific Gravity, Urine: >=1.03 (ref 1.005–1.030)
pH: 6 (ref 5.0–8.0)

## 2021-06-11 LAB — COMPREHENSIVE METABOLIC PANEL
ALT: 22 U/L (ref 0–44)
AST: 16 U/L (ref 15–41)
Albumin: 4.4 g/dL (ref 3.5–5.0)
Alkaline Phosphatase: 42 U/L (ref 38–126)
Anion gap: 8 (ref 5–15)
BUN: 20 mg/dL (ref 6–20)
CO2: 28 mmol/L (ref 22–32)
Calcium: 9.9 mg/dL (ref 8.9–10.3)
Chloride: 102 mmol/L (ref 98–111)
Creatinine, Ser: 1.53 mg/dL — ABNORMAL HIGH (ref 0.61–1.24)
GFR, Estimated: 55 mL/min — ABNORMAL LOW (ref >60)
Glucose, Bld: 99 mg/dL (ref 70–99)
Potassium: 4.3 mmol/L (ref 3.5–5.1)
Sodium: 138 mmol/L (ref 135–145)
Total Bilirubin: 0.9 mg/dL (ref 0.3–1.2)
Total Protein: 7.3 g/dL (ref 6.5–8.1)

## 2021-06-11 LAB — CBC WITH DIFFERENTIAL/PLATELET
Abs Immature Granulocytes: 0.04 10*3/uL (ref 0.00–0.07)
Basophils Absolute: 0.1 10*3/uL (ref 0.0–0.1)
Basophils Relative: 1 %
Eosinophils Absolute: 0.1 10*3/uL (ref 0.0–0.5)
Eosinophils Relative: 1 %
HCT: 41.9 % (ref 39.0–52.0)
Hemoglobin: 14.7 g/dL (ref 13.0–17.0)
Immature Granulocytes: 0 %
Lymphocytes Relative: 10 %
Lymphs Abs: 1.2 10*3/uL (ref 0.7–4.0)
MCH: 32.7 pg (ref 26.0–34.0)
MCHC: 35.1 g/dL (ref 30.0–36.0)
MCV: 93.3 fL (ref 80.0–100.0)
Monocytes Absolute: 1 10*3/uL (ref 0.1–1.0)
Monocytes Relative: 8 %
Neutro Abs: 9.6 10*3/uL — ABNORMAL HIGH (ref 1.7–7.7)
Neutrophils Relative %: 80 %
Platelets: 265 10*3/uL (ref 150–400)
RBC: 4.49 MIL/uL (ref 4.22–5.81)
RDW: 12 % (ref 11.5–15.5)
WBC: 12 10*3/uL — ABNORMAL HIGH (ref 4.0–10.5)
nRBC: 0 % (ref 0.0–0.2)

## 2021-06-11 LAB — LIPASE, BLOOD: Lipase: 37 U/L (ref 11–51)

## 2021-06-11 MED ORDER — OXYCODONE-ACETAMINOPHEN 5-325 MG PO TABS: 1.0000 | ORAL_TABLET | Freq: Four times a day (QID) | ORAL | 0 refills | Status: DC | PRN

## 2021-06-11 MED ORDER — IOHEXOL 300 MG/ML  SOLN
100.0000 mL | Freq: Once | INTRAMUSCULAR | Status: AC | PRN
  Administered 2021-06-11: 100 mL via INTRAVENOUS

## 2021-06-11 MED ORDER — SODIUM CHLORIDE 0.9 % IV BOLUS
1000.0000 mL | Freq: Once | INTRAVENOUS | Status: AC
  Administered 2021-06-11: 1000 mL via INTRAVENOUS

## 2021-06-11 MED ORDER — KETOROLAC TROMETHAMINE 15 MG/ML IJ SOLN
15.0000 mg | Freq: Once | INTRAMUSCULAR | Status: AC
  Administered 2021-06-11: 15 mg via INTRAVENOUS
  Filled 2021-06-11: qty 1

## 2021-06-11 MED ORDER — ONDANSETRON HCL 4 MG/2ML IJ SOLN
4.0000 mg | Freq: Once | INTRAMUSCULAR | Status: AC
  Administered 2021-06-11: 4 mg via INTRAVENOUS
  Filled 2021-06-11: qty 2

## 2021-06-11 MED ORDER — HYDROMORPHONE HCL 1 MG/ML IJ SOLN
0.5000 mg | Freq: Once | INTRAMUSCULAR | Status: AC
  Administered 2021-06-11: 0.5 mg via INTRAVENOUS
  Filled 2021-06-11: qty 1

## 2021-06-11 NOTE — ED Triage Notes (Signed)
Generalized abdominal pain since earlier today. Intermittent radiation to back. Denies n/v, fevers. Endorses constant belching. Reports eating cracker barrel prior to pain starting.

## 2021-06-11 NOTE — Discharge Instructions (Signed)
Please read and follow all provided instructions.  Your diagnoses today include:  1. Ureteral colic   2. RUQ pain     Tests performed today include: Urine test that was normal CT scan which showed a 3 millimeter kidney on the right side Blood test that showed normal kidney function Vital signs. See below for your results today.   Medications prescribed:  Percocet (oxycodone/acetaminophen) - narcotic pain medication  DO NOT drive or perform any activities that require you to be awake and alert because this medicine can make you drowsy. BE VERY CAREFUL not to take multiple medicines containing Tylenol (also called acetaminophen). Doing so can lead to an overdose which can damage your liver and cause liver failure and possibly death.  Take any prescribed medications only as directed.  Home care instructions:  Follow any educational materials contained in this packet.  Please double your fluid intake for the next several days. Strain your urine and save any stones that may pass.   BE VERY CAREFUL not to take multiple medicines containing Tylenol (also called acetaminophen). Doing so can lead to an overdose which can damage your liver and cause liver failure and possibly death.   Follow-up instructions: Please follow-up with your urologist or the urologist referral (provided on front page) in the next 1 week for further evaluation of your symptoms.  Return instructions:  If you need to return to the Emergency Department, go to [facility] and not [facility]. The urologists are located at [facility] and can better care for you at this location.  Please return to the Emergency Department if you experience worsening symptoms.  Please return if you develop fever or uncontrolled pain or vomiting. Please return if you have any other emergent concerns.  Additional Information:  Your vital signs today were: BP 119/72    Pulse (!) 56    Temp 98.2 F (36.8 C) (Oral)     Resp 17    Ht 6\' 1"  (1.854 m)    Wt 108.9 kg    SpO2 96%    BMI 31.66 kg/m  If your blood pressure (BP) was elevated above 135/85 this visit, please have this repeated by your doctor within one month. --------------

## 2021-06-11 NOTE — ED Provider Notes (Signed)
[name] EMERGENCY DEPARTMENT Provider Note   CSN: [id number] Arrival date & time: 06/11/21  1550     History Chief Complaint  Patient presents with   Abdominal Pain    Timothy Newman is a 50 y.o. male.  Patient with history of kidney stones presents to the emergency department for evaluation of abdominal pain.  Symptoms started a couple of hours ago after eating breakfast.  Patient reports pain in the abdomen that then radiated to the right flank.  He had associated nausea without vomiting.  No blood or urinary changes noted.  He has had some urinary hesitancy.  Symptoms are similar in some ways to previous kidney stones, but he does not typically have anterior abdominal pain with his stones.  No history of gallbladder problems.  No treatments prior to arrival.  The onset of this condition was acute. The course is constant. Aggravating factors: none. Alleviating factors: none.        Past Medical History:  Diagnosis Date   Family history of abdominal aortic aneurysm (AAA) 05/16/2019   Inflamed sebaceous cyst s/p excision Nov 2014 05/12/2013   MRSA cellulitis ?2004   I&D in ED.  IV Vanco w PICC   Umbilical hernia - 72mm 64/33/2951    Patient Active Problem List   Diagnosis Date Noted   Primary osteoarthritis of left hip 11/03/2020   Chronic hip pain, left 10/28/2020   Kidney stones 05/16/2019   Hyperlipidemia 05/16/2019   Obesity (BMI 30.0-34.9) 88/41/6606   Umbilical hernia - 17mm 30/16/0109    Past Surgical History:  Procedure Laterality Date   INCISION AND DRAINAGE ABSCESS / HEMATOMA OF BURSA / KNEE / THIGH Right ?2004   ?Dr Hassell Halim    WISDOM TOOTH EXTRACTION  1985       Family History  Problem Relation Age of Onset   Asthma Mother    Fibromyalgia Mother    Hyperlipidemia Mother    Melanoma Father        with brain mets   AAA (abdominal aortic aneurysm) Maternal Grandmother    Lung cancer Maternal Grandfather        worked in Manufacturing engineer    Cancer Paternal Grandmother    Heart failure Paternal Grandfather 90    Social History   Tobacco Use   Smoking status: Former    Packs/day: 1.00    Years: 3.00    Pack years: 3.00    Types: Cigarettes    Quit date: 05/14/1992    Years since quitting: 29.0   Smokeless tobacco: Former    Types: Snuff    Quit date: 05/15/2015  Vaping Use   Vaping Use: Never used  Substance Use Topics   Alcohol use: No   Drug use: No    Home Medications Prior to Admission medications   Medication Sig Start Date End Date Taking? Authorizing Provider  albuterol (VENTOLIN HFA) 108 (90 Base) MCG/ACT inhaler Inhale 2 puffs into the lungs every 6 (six) hours as needed for wheezing or shortness of breath. 04/01/20   Marge Duncans, PA-C  cephALEXin (KEFLEX) 500 MG capsule Take  1 capsule  4 x /day  with Meals & Bedtime  with Food for Infection 01/13/21   Unk Pinto, MD  Cholecalciferol (VITAMIN D) 125 MCG (5000 UT) CAPS Take 1 capsule by mouth daily.    [provider]  ciclopirox (LOPROX) 0.77 % cream Apply topically 2 (two) times daily. For skin rash, 2-4 weeks. 10/29/20   Liane Comber, NP  Multiple Vitamin (MULTIVITAMIN) tablet Take 1 tablet by mouth daily.    [provider]  OVER THE COUNTER MEDICATION Airborne gummy 2 per day for Vitamin C    [provider]  OVER THE COUNTER MEDICATION Vitamin B12 1 gummy daily.    [provider]  tamsulosin (FLOMAX) 0.4 MG CAPS capsule Take 1 cap as needed with onset of kidney stone pain. 10/28/20   Liane Comber, NP    Allergies    Patient has no known allergies.  Review of Systems   Review of Systems  Constitutional:  Negative for fever.  HENT:  Negative for rhinorrhea and sore throat.   Eyes:  Negative for redness.  Respiratory:  Negative for cough.   Cardiovascular:  Negative for chest pain.  Gastrointestinal:  Positive for abdominal pain and nausea. Negative for diarrhea and vomiting.  Genitourinary:   Positive for difficulty urinating and flank pain. Negative for dysuria and hematuria.  Musculoskeletal:  Negative for myalgias.  Skin:  Negative for rash.  Neurological:  Negative for headaches.   Physical Exam Updated Vital Signs BP 129/85    Pulse (!) 56    Temp 98.1 F (36.7 C) (Oral)    Resp 18    Ht 6\' 1"  (1.854 m)    Wt 108.9 kg    SpO2 99%    BMI 31.66 kg/m   Physical Exam Vitals and nursing note reviewed.  Constitutional:      General: He is not in acute distress.    Appearance: He is well-developed.  HENT:     Head: Normocephalic and atraumatic.  Eyes:     General:        Right eye: No discharge.        Left eye: No discharge.     Conjunctiva/sclera: Conjunctivae normal.  Cardiovascular:     Rate and Rhythm: Normal rate and regular rhythm.     Heart sounds: Normal heart sounds.  Pulmonary:     Effort: Pulmonary effort is normal.     Breath sounds: Normal breath sounds.  Abdominal:     Palpations: Abdomen is soft.     Tenderness: There is generalized abdominal tenderness (Mild to moderate). There is no guarding. Negative signs include Murphy's sign and McBurney's sign.  Musculoskeletal:     Cervical back: Normal range of motion and neck supple.  Skin:    General: Skin is warm and dry.  Neurological:     Mental Status: He is alert.    ED Results / Procedures / Treatments   Labs (all labs ordered are listed, but only abnormal results are displayed) Labs Reviewed  COMPREHENSIVE METABOLIC PANEL  LIPASE, BLOOD  CBC WITH DIFFERENTIAL/PLATELET  URINALYSIS, ROUTINE W REFLEX MICROSCOPIC    EKG None  Radiology No results found.  Procedures Procedures   Medications Ordered in ED Medications  ketorolac (TORADOL) 15 MG/ML injection 15 mg (has no administration in time range)  ondansetron (ZOFRAN) injection 4 mg (has no administration in time range)    ED Course  I have reviewed the triage vital signs and the nursing notes.  Pertinent labs & imaging  results that were available during my care of the patient were reviewed by me and considered in my medical decision making (see chart for details).  Patient seen and examined. Plan discussed with patient.   Labs: CBC, CMP, lipase, UA  Imaging: Pending results  Medications/Fluids: IV Toradol, Zofran  Vital signs reviewed and are as follows: BP 129/85    Pulse Marland Kitchen)  56    Temp 98.1 F (36.7 C) (Oral)    Resp 18    Ht 6\' 1"  (1.854 m)    Wt 108.9 kg    SpO2 99%    BMI 31.66 kg/m   Initial impression: Possible ureteral colic.  Cannot rule out biliary colic.  Awaiting results of labs and UA to determine further need for testing.  7:40 PM patient rechecked several times with improvement in symptoms.  Initially UA was clear.  Right upper quadrant ultrasound was ordered given right-sided abdominal and flank pain.  This was negative.  Discussed with patient symptom control versus additional testing with CT imaging.  Patient and wife would like CT while here.  CT demonstrates 3 mm right-sided ureteral stone causing obstruction.  Otherwise no acute findings.  Symptoms are improved and he is feeling ready for discharge.  Patient has Flomax at home.  Patient counseled on kidney stone treatment. Urged patient to strain urine and save any stones. Urged urology follow-up and return to [facility] with any complications. Counseled patient to maintain good fluid intake.   Patient counseled on use of narcotic pain medications. Counseled not to combine these medications with others containing tylenol. Urged not to drink alcohol, drive, or perform any other activities that requires focus while taking these medications. The patient verbalizes understanding and agrees with the plan.      MDM Rules/Calculators/A&P                          Patient with severe abdominal pain earlier today, now improved.  Work-up as above.  Symptoms likely related to ureteral stone noted on CT imaging.  Right upper quadrant ultrasound  negative.  Labs otherwise reassuring.  Patient's kidney function was slightly elevated from baseline, treated with IV fluids.  He is now hungry and ready for discharge.     Final Clinical Impression(s) / ED Diagnoses Final diagnoses:  RUQ pain  Ureteral colic    Rx / DC Orders ED Discharge Orders          Ordered    oxyCODONE-acetaminophen (PERCOCET/ROXICET) 5-325 MG tablet  Every 6 hours PRN        06/11/21 1939             Carlisle Cater, PA-C 06/11/21 1942    Deno Etienne, DO 06/11/21 2239

## 2021-06-15 NOTE — Progress Notes (Signed)
Assessment and Plan:  Diagnoses and all orders for this visit:  Right ureteral stone Stone passed; est with [facility] Push water intake Hold flomax if needed for future stone  Abnormal renal function Stone passed, recheck urine  -     BASIC METABOLIC PANEL WITH GFR   Further disposition pending results of labs. Discussed med's effects and SE's.   Over 15 minutes of exam, counseling, chart review, and critical decision making was performed.   Future Appointments  Date Time Provider [place]  07/05/2021  9:30 AM Mcarthur Rossetti, MD [facility] None  10/31/2021  3:00 PM Magda Bernheim, NP [facility] None    ------------------------------------------------------------------------------------------------------------------   HPI BP 122/80    Pulse 77    Temp 97.7 F (36.5 C)    Wt 251 lb (113.9 kg)    SpO2 99%    BMI 33.12 kg/m  50 y.o.male presents for ED follow up on ureteral stone/colic and renal functions.   He presented to ED on 06/11/2021 with R flank pain, CT showed 3 mm right-sided ureteral stone causing obstruction, otherwise no acute findings.   He was discharged home with flomax and percocet, and instructions to strain urine and save stone, urology follow up (est with [facility] since last stone 02/2019). He reports stone passed 3-4 days later and does have saved for his next visit with [facility]. Denies any residual sx, hematuria, dysuria, urgency/frequency.   Lab Results  Component Value Date   GFRNONAA 59 (L) 06/11/2021   Lab Results  Component Value Date   CREATININE 1.53 (H) 06/11/2021   CREATININE 1.32 10/28/2020   CREATININE 1.16 10/27/2019     His blood pressure has been controlled at home, today their BP is BP: 122/80 He denies chest pain, shortness of breath, dizziness.   Past Medical History:  Diagnosis Date   Family history of abdominal aortic aneurysm (AAA) 05/16/2019   Inflamed sebaceous cyst s/p excision Nov 2014 05/12/2013    MRSA cellulitis ?2004   I&D in ED.  IV Vanco w PICC   Umbilical hernia - 2mm 27/78/2423     No Known Allergies  Current Outpatient Medications on File Prior to Visit  Medication Sig   albuterol (VENTOLIN HFA) 108 (90 Base) MCG/ACT inhaler Inhale 2 puffs into the lungs every 6 (six) hours as needed for wheezing or shortness of breath.   cephALEXin (KEFLEX) 500 MG capsule Take  1 capsule  4 x /day  with Meals & Bedtime  with Food for Infection   Cholecalciferol (VITAMIN D) 125 MCG (5000 UT) CAPS Take 1 capsule by mouth daily.   ciclopirox (LOPROX) 0.77 % cream Apply topically 2 (two) times daily. For skin rash, 2-4 weeks.   Multiple Vitamin (MULTIVITAMIN) tablet Take 1 tablet by mouth daily.   OVER THE COUNTER MEDICATION Airborne gummy 2 per day for Vitamin C   OVER THE COUNTER MEDICATION Vitamin B12 1 gummy daily.   oxyCODONE-acetaminophen (PERCOCET/ROXICET) 5-325 MG tablet Take 1 tablet by mouth every 6 (six) hours as needed for severe pain.   tamsulosin (FLOMAX) 0.4 MG CAPS capsule Take 1 cap as needed with onset of kidney stone pain.   No current facility-administered medications on file prior to visit.    ROS: all negative except above.   Physical Exam:  BP 122/80    Pulse 77    Temp 97.7 F (36.5 C)    Wt 251 lb (113.9 kg)    SpO2 99%    BMI 33.12 kg/m  General Appearance: Well nourished, in no apparent distress. Eyes: PERRL, conjunctiva no swelling or erythema ENT/Mouth: mask in place; Hearing normal.  Neck: Supple Respiratory: Respiratory effort normal Cardio: Appears well perfused Abdomen: Soft, + BS.  Non tender, no guarding Lymphatics: Non tender without lymphadenopathy.  Musculoskeletal: no obvious deformity; normal gait.  Skin: Warm, dry without rashes, lesions, ecchymosis.  Neuro: Normal muscle tone Psych: Awake and oriented X 3, normal affect, Insight and Judgment appropriate.     Izora Ribas, NP 9:22 AM [facility]

## 2021-06-16 ENCOUNTER — Encounter: Payer: Self-pay | Admitting: Adult Health

## 2021-06-16 ENCOUNTER — Other Ambulatory Visit: Payer: Self-pay

## 2021-06-16 ENCOUNTER — Ambulatory Visit (INDEPENDENT_AMBULATORY_CARE_PROVIDER_SITE_OTHER): Payer: 59 | Admitting: Adult Health

## 2021-06-16 ENCOUNTER — Telehealth: Payer: Self-pay

## 2021-06-16 VITALS — BP 122/80 | HR 77 | Temp 97.7°F | Wt 251.0 lb

## 2021-06-16 DIAGNOSIS — N289 Disorder of kidney and ureter, unspecified: Secondary | ICD-10-CM | POA: Diagnosis not present

## 2021-06-16 DIAGNOSIS — N201 Calculus of ureter: Secondary | ICD-10-CM | POA: Diagnosis not present

## 2021-06-16 DIAGNOSIS — N400 Enlarged prostate without lower urinary tract symptoms: Secondary | ICD-10-CM | POA: Insufficient documentation

## 2021-06-16 DIAGNOSIS — N281 Cyst of kidney, acquired: Secondary | ICD-10-CM | POA: Insufficient documentation

## 2021-06-16 NOTE — Telephone Encounter (Signed)
Estill Bamberg [facility]) called and is concerned about CT results. Michela Pitcher that he has a cyst on kidney and an enlarged prostate. Requesting to talk to [place] as soon as possible.

## 2021-06-17 LAB — BASIC METABOLIC PANEL WITH GFR
BUN: 22 mg/dL (ref 7–25)
CO2: 27 mmol/L (ref 20–32)
Calcium: 9.4 mg/dL (ref 8.6–10.3)
Chloride: 103 mmol/L (ref 98–110)
Creat: 1.09 mg/dL (ref 0.70–1.30)
Glucose, Bld: 90 mg/dL (ref 65–99)
Potassium: 4.8 mmol/L (ref 3.5–5.3)
Sodium: 138 mmol/L (ref 135–146)
eGFR: 82 mL/min/{1.73_m2} (ref >=60)

## 2021-06-21 NOTE — Telephone Encounter (Signed)
Left message on wife's voicemail to call back on 06/17/21

## 2021-06-28 ENCOUNTER — Ambulatory Visit: Payer: 59 | Admitting: Orthopaedic Surgery

## 2021-07-05 ENCOUNTER — Encounter: Payer: Self-pay | Admitting: Orthopaedic Surgery

## 2021-07-05 ENCOUNTER — Ambulatory Visit (INDEPENDENT_AMBULATORY_CARE_PROVIDER_SITE_OTHER): Payer: 59 | Admitting: Orthopaedic Surgery

## 2021-07-05 DIAGNOSIS — M25552 Pain in left hip: Secondary | ICD-10-CM

## 2021-07-05 DIAGNOSIS — M1612 Unilateral primary osteoarthritis, left hip: Secondary | ICD-10-CM | POA: Diagnosis not present

## 2021-07-05 NOTE — Progress Notes (Signed)
The patient comes in today for continued follow-up as it relates to his well-documented end-stage arthritis of his left hip.  He is only 51 years old.  His x-rays show complete loss of the left hip joint space with bone-on-bone wear.  He has had several radiographic guided steroid injections in that left hip.  The last one he had in September of this past year only lasted for about a month.  At this point his left hip pain is definitely affecting his mobility, his quality of life and his actives daily living.  He is felt conservative treatment for over 12 months.  He is interested in proceeding with a left hip replacement sometime later in March of this year.  On exam he does walk with a Trendelenburg gait.  He has significant stiffness with attempts of internal and external rotation of the left hip.  His right hip moves smoothly and fluidly.  He has trouble crossing his leg and putting his shoes and socks on on the left side.  This is also detrimentally affecting his posture at this point.  I did go over previous x-rays of his left hip from September of this year.  It shows complete loss of the superior lateral joint space and large osteophytes around the hip joint.  There is also sclerotic changes in the acetabular and femoral head.  This is end-stage arthritis.  He has no acute medical issues and is not a diabetic and not on blood thinning medication.  There is no listed headache, chest pain, shortness of breath, fever, chills, nausea, vomiting.  We talked in length in detail about hip replacement surgery.  I described the risk and benefits of surgery and talked about the interoperative postoperative course and what to expect.  He already has a handout on this type of surgery as well because we have discussed this before.  We will work on getting surgery scheduled and be in touch for surgery in late March of this year.

## 2021-08-08 ENCOUNTER — Other Ambulatory Visit: Payer: Self-pay

## 2021-09-02 NOTE — Progress Notes (Signed)
Sent message, via epic in basket, requesting orders in epic from surgeon.  

## 2021-09-08 ENCOUNTER — Other Ambulatory Visit: Payer: Self-pay | Admitting: Physician Assistant

## 2021-09-08 NOTE — Progress Notes (Addendum)
COVID swab appointment: ? ?COVID Vaccine Completed:  No ?Date COVID Vaccine completed: ?Has received booster: ?COVID vaccine manufacturer: [place]  ? ?Date of COVID positive in last 90 days:  No ? ?PCP - Unk Pinto, MD ?Cardiologist - N/A ? ?Chest x-ray - N/A ?EKG - 10-28-20 Epic ?Stress Test -  greater than 5 years ago ?ECHO - N/A ?Cardiac Cath - N/A ?Pacemaker/ICD device last checked: ?Spinal Cord Stimulator: ? ?Bowel Prep - N/A ? ?Sleep Study - N/A ?CPAP -  ? ?Fasting Blood Sugar - N/A ?Checks Blood Sugar _____ times a day ? ?Blood Thinner Instructions:  N/A ?Aspirin Instructions: ?Last Dose: ? ?Activity level:   Can go up a flight of stairs and perform activities of daily living without stopping and without symptoms of chest pain or shortness of breath.  ? ?Anesthesia review: N/A ? ?Patient denies shortness of breath, fever, cough and chest pain at PAT appointment ? ?Patient verbalized understanding of instructions that were given to them at the PAT appointment. Patient was also instructed that they will need to review over the PAT instructions again at home before surgery.  ?

## 2021-09-08 NOTE — Patient Instructions (Addendum)
DUE TO COVID-19 ONLY ONE VISITOR  (aged 52 and older)  IS ALLOWED TO COME WITH YOU AND STAY IN THE WAITING ROOM ONLY DURING PRE OP AND PROCEDURE.   ?**NO VISITORS ARE ALLOWED IN THE SHORT STAY AREA OR RECOVERY ROOM!!** ? ?IF YOU WILL BE ADMITTED INTO THE HOSPITAL YOU ARE ALLOWED ONLY TWO SUPPORT PEOPLE DURING VISITATION HOURS ONLY (7 AM -8PM)   ?The support person(s) must pass our screening, gel in and out, and wear a mask at all times, including in the patient?s room. ?Patients must also wear a mask when staff or their support person are in the room. ?Visitors GUEST BADGE MUST BE WORN VISIBLY  ?One adult visitor may remain with you overnight and MUST be in the room by 8 P.M.  ? ?You are not required to quarantine, however you are required to wear a well-fitted mask when you are out and around people not in your household.  ?Hand Hygiene often ?Do NOT share personal items ?Notify your provider if you are in close contact with someone who has COVID or you develop fever 100.4 or greater, new onset of sneezing, cough, sore throat, shortness of breath or body aches. ? ?     ? Your procedure is scheduled on: Friday, 09-23-21 ? ? Report to [facility] Main Entrance ? ?  Report to admitting at 5:45 AM ? ? Call this number if you have problems the morning of surgery [phone] ? ? Do not eat food :After Midnight. ? ? After Midnight you may have the following liquids until 5:30 AM DAY OF SURGERY ? ?Water ?Black Coffee (sugar ok, NO MILK/CREAM OR CREAMERS)  ?Tea (sugar ok, NO MILK/CREAM OR CREAMERS) regular and decaf                             ?Plain Jell-O (NO RED)                                           ?Fruit ices (not with fruit pulp, NO RED)                                     ?Popsicles (NO RED)                                                                  ?Juice: apple, WHITE grape, WHITE cranberry ?Sports drinks like Gatorade (NO RED) ?Clear broth(vegetable,chicken,beef) ? ?             ?Drink 1 Ensure  drink AT 5:30 AM the morning of surgery. ?  ?  ?The day of surgery:  ?Drink ONE (1) Pre-Surgery Clear Ensure at 5:30 AM the morning of surgery. Drink in one sitting. Do not sip.  ?This drink was given to you during your hospital  ?pre-op appointment visit. ?Nothing else to drink after completing the Pre-Surgery Clear Ensure  ?  ?       If you have questions, please contact your surgeon?s office. ? ? ?FOLLOW ANY ADDITIONAL PRE OP  INSTRUCTIONS YOU RECEIVED FROM YOUR SURGEON'S OFFICE!!! ?  ?  ?Oral Hygiene is also important to reduce your risk of infection.                                    ?Remember - BRUSH YOUR TEETH THE MORNING OF SURGERY WITH YOUR REGULAR TOOTHPASTE ? ? Do NOT smoke after Midnight ? ?Take these medicines the morning of surgery with A SIP OF WATER:  Okay to use Albuterol inhaler ?                  ?           You may not have any metal on your body including  jewelry, and body piercing ? ?           Do not wear lotions, powders, cologne, or deodorant ? ?            Men may shave face and neck. ? ? Do not bring valuables to the hospital. [place]. ? ? Contacts, dentures or bridgework may not be worn into surgery. ? ? Bring small overnight bag day of surgery. ? ?Please read over the following fact sheets you were given: IF Timothy Newman  ? ?[place] - Preparing for Surgery ?Before surgery, you can play an important role.  Because skin is not sterile, your skin needs to be as free of germs as possible.  You can reduce the number of germs on your skin by washing with CHG (chlorahexidine gluconate) soap before surgery.  CHG is an antiseptic cleaner which kills germs and bonds with the skin to continue killing germs even after washing. ?Please DO NOT use if you have an allergy to CHG or antibacterial soaps.  If your skin becomes reddened/irritated stop using the CHG and inform your nurse when you  arrive at Short Stay. ?Do not shave (including legs and underarms) for at least 48 hours prior to the first CHG shower.  You may shave your face/neck. ? ?Please follow these instructions carefully: ? 1.  Shower with CHG Soap the night before surgery and the  morning of surgery. ? 2.  If you choose to wash your hair, wash your hair first as usual with your normal  shampoo. ? 3.  After you shampoo, rinse your hair and body thoroughly to remove the shampoo.                            ? 4.  Use CHG as you would any other liquid soap.  You can apply chg directly to the skin and wash.  Gently with a scrungie or clean washcloth. ? 5.  Apply the CHG Soap to your body ONLY FROM THE NECK DOWN.   Do   not use on face/ open      ?                     Wound or open sores. Avoid contact with eyes, ears mouth and   genitals (private parts).  ?                     Production manager,  Genitals (private parts) with your normal soap. ?  6.  Wash thoroughly, paying special attention to the area where your    surgery  will be performed. ? 7.  Thoroughly rinse your body with warm water from the neck down. ? 8.  DO NOT shower/wash with your normal soap after using and rinsing off the CHG Soap. ?               9.  Pat yourself dry with a clean towel. ?           10.  Wear clean pajamas. ?           11.  Place clean sheets on your bed the night of your first shower and do not  sleep with pets. ?Day of Surgery : ?Do not apply any lotions/deodorants the morning of surgery.  Please wear clean clothes to the hospital/surgery center. ? ?FAILURE TO FOLLOW THESE INSTRUCTIONS MAY RESULT IN THE CANCELLATION OF YOUR SURGERY ? ?PATIENT SIGNATURE_________________________________ ? ?NURSE SIGNATURE__________________________________ ? ?________________________________________________________________________  ?  ? ?Incentive Spirometer ? ?An incentive spirometer is a tool that can help keep your lungs clear and active. This tool measures how well you  are filling your lungs with each breath. Taking long deep breaths may help reverse or decrease the chance of developing breathing (pulmonary) problems (especially infection) following: ?A long period of time when you are unable to move or be active. ?BEFORE THE PROCEDURE  ?If the spirometer includes an indicator to show your best effort, your nurse or respiratory therapist will set it to a desired goal. ?If possible, sit up straight or lean slightly forward. Try not to slouch. ?Hold the incentive spirometer in an upright position. ?INSTRUCTIONS FOR USE  ?Sit on the edge of your bed if possible, or sit up as far as you can in bed or on a chair. ?Hold the incentive spirometer in an upright position. ?Breathe out normally. ?Place the mouthpiece in your mouth and seal your lips tightly around it. ?Breathe in slowly and as deeply as possible, raising the piston or the ball toward the top of the column. ?Hold your breath for 3-5 seconds or for as long as possible. Allow the piston or ball to fall to the bottom of the column. ?Remove the mouthpiece from your mouth and breathe out normally. ?Rest for a few seconds and repeat Steps 1 through 7 at least 10 times every 1-2 hours when you are awake. Take your time and take a few normal breaths between deep breaths. ?The spirometer may include an indicator to show your best effort. Use the indicator as a goal to work toward during each repetition. ?After each set of 10 deep breaths, practice coughing to be sure your lungs are clear. If you have an incision (the cut made at the time of surgery), support your incision when coughing by placing a pillow or rolled up towels firmly against it. ?Once you are able to get out of bed, walk around indoors and cough well. You may stop using the incentive spirometer when instructed by your caregiver.  ?RISKS AND COMPLICATIONS ?Take your time so you do not get dizzy or light-headed. ?If you are in pain, you may need to take or ask for pain  medication before doing incentive spirometry. It is harder to take a deep breath if you are having pain. ?AFTER USE ?Rest and breathe slowly and easily. ?It can be helpful to keep track of a log of your pr

## 2021-09-12 ENCOUNTER — Encounter (HOSPITAL_COMMUNITY)
Admission: RE | Admit: 2021-09-12 | Discharge: 2021-09-12 | Disposition: A | Discharge status: Home / Self Care | Payer: 59 | Source: Ambulatory Visit | Attending: Orthopaedic Surgery | Admitting: Orthopaedic Surgery

## 2021-09-12 ENCOUNTER — Other Ambulatory Visit: Payer: Self-pay

## 2021-09-12 ENCOUNTER — Encounter (HOSPITAL_COMMUNITY): Payer: Self-pay

## 2021-09-12 VITALS — BP 122/81 | HR 72 | Temp 98.9°F | Resp 18 | Ht 73.0 in | Wt 252.4 lb

## 2021-09-12 DIAGNOSIS — Z01812 Encounter for preprocedural laboratory examination: Secondary | ICD-10-CM | POA: Diagnosis present

## 2021-09-12 DIAGNOSIS — M1612 Unilateral primary osteoarthritis, left hip: Secondary | ICD-10-CM | POA: Insufficient documentation

## 2021-09-12 HISTORY — DX: Unspecified osteoarthritis, unspecified site: M19.90

## 2021-09-12 HISTORY — DX: Personal history of urinary calculi: Z87.442

## 2021-09-12 LAB — SURGICAL PCR SCREEN
MRSA, PCR: NEGATIVE
Staphylococcus aureus: POSITIVE — AB

## 2021-09-12 LAB — CBC
HCT: 45.3 % (ref 39.0–52.0)
Hemoglobin: 15.7 g/dL (ref 13.0–17.0)
MCH: 32.6 pg (ref 26.0–34.0)
MCHC: 34.7 g/dL (ref 30.0–36.0)
MCV: 94.2 fL (ref 80.0–100.0)
Platelets: 259 10*3/uL (ref 150–400)
RBC: 4.81 MIL/uL (ref 4.22–5.81)
RDW: 12.2 % (ref 11.5–15.5)
WBC: 7.8 10*3/uL (ref 4.0–10.5)
nRBC: 0 % (ref 0.0–0.2)

## 2021-09-12 LAB — TYPE AND SCREEN
ABO/RH(D): O POS
Antibody Screen: NEGATIVE

## 2021-09-12 NOTE — Progress Notes (Signed)
PCR results sent to Dr. Blackman to review. 

## 2021-09-20 ENCOUNTER — Telehealth: Payer: Self-pay | Admitting: Orthopaedic Surgery

## 2021-09-20 NOTE — Telephone Encounter (Signed)
Patient wife Timothy Newman called advised patient has tested positive for Staph and wanted to know when can patient be treated for the infection?  The number to contact patient is [phone] ?

## 2021-09-22 NOTE — H&P (Signed)
TOTAL HIP ADMISSION H&P ? ?Patient is admitted for left total hip arthroplasty. ? ?Subjective: ? ?Chief Complaint: left hip pain ? ?HPI: Timothy Newman, 51 y.o. male, has a history of pain and functional disability in the left hip(s) due to arthritis and patient has failed non-surgical conservative treatments for greater than 12 weeks to include NSAID's and/or analgesics, corticosteriod injections, flexibility and strengthening excercises, and activity modification.  Onset of symptoms was gradual starting 2 years ago with gradually worsening course since that time.The patient noted no past surgery on the left hip(s).  Patient currently rates pain in the left hip at 10 out of 10 with activity. Patient has night pain, worsening of pain with activity and weight bearing, trendelenberg gait, pain that interfers with activities of daily living, and pain with passive range of motion. Patient has evidence of subchondral sclerosis, periarticular osteophytes, and joint space narrowing by imaging studies. This condition presents safety issues increasing the risk of falls.  There is no current active infection. ? ?Patient Active Problem List  ? Diagnosis Date Noted  ? Enlarged prostate 06/16/2021  ? Renal cyst 06/16/2021  ? Primary osteoarthritis of left hip 11/03/2020  ? Chronic hip pain, left 10/28/2020  ? Kidney stones 05/16/2019  ? Hyperlipidemia 05/16/2019  ? Obesity (BMI 30.0-34.9) 05/14/2018  ? Umbilical hernia - 76m 147/82/9562 ? ?Past Medical History:  ?Diagnosis Date  ? Arthritis   ? Family history of abdominal aortic aneurysm (AAA) 05/16/2019  ? History of kidney stones   ? Inflamed sebaceous cyst s/p excision Nov 2014 05/12/2013  ? MRSA cellulitis ?2004  ? I&D in ED.  IV Vanco w PICC  ? Umbilical hernia - [id number]?  ?Past Surgical History:  ?Procedure Laterality Date  ? INCISION AND DRAINAGE ABSCESS / HEMATOMA OF BURSA / KNEE / THIGH Right ?2004  ? ?[facility]  ?  ?No  current facility-administered medications for this encounter.  ? ?Current Outpatient Medications  ?Medication Sig Dispense Refill Last Dose  ? Cholecalciferol (VITAMIN D) 125 MCG (5000 UT) CAPS Take 10,000 Units by mouth at bedtime.     ? Cyanocobalamin (VITAMIN B-12 PO) Take 1 tablet by mouth at bedtime. gummy     ? Multiple Vitamins-Minerals (AIRBORNE) CHEW Chew 3 tablets by mouth at bedtime.     ? ?No Known Allergies  ?Social History  ? ?Tobacco Use  ? Smoking status: Former  ?  Packs/day: 1.00  ?  Years: 3.00  ?  Pack years: 3.00  ?  Types: Cigarettes  ?  Quit date: 05/14/1992  ?  Years since quitting: 29.3  ? Smokeless tobacco: Former  ?  Types: Snuff  ?  Quit date: 05/15/2015  ?Substance Use Topics  ? Alcohol use: No  ?  ?Family History  ?Problem Relation Age of Onset  ? Asthma Mother   ? Fibromyalgia Mother   ? Hyperlipidemia Mother   ? Melanoma Father   ?     with brain mets  ? AAA (abdominal aortic aneurysm) Maternal Grandmother   ? Lung cancer Maternal Grandfather   ?     worked in fiManufacturing engineer? Cancer Paternal Grandmother   ? Heart failure Paternal Grandfather [phone]?  ? ?Review of Systems  ?Musculoskeletal:  Positive for gait problem.  ?All other systems reviewed and are negative. ? ?Objective: ? ?Physical Exam ?Vitals reviewed.  ?Constitutional:   ?   Appearance: Normal appearance.  ?  HENT:  ?   Head: Normocephalic and atraumatic.  ?Eyes:  ?   Extraocular Movements: Extraocular movements intact.  ?   Pupils: Pupils are equal, round, and reactive to light.  ?Cardiovascular:  ?   Rate and Rhythm: Normal rate and regular rhythm.  ?   Pulses: Normal pulses.  ?Musculoskeletal:  ?   Cervical back: Normal range of motion and neck supple.  ?   Left hip: Tenderness and bony tenderness present. Decreased range of motion. Decreased strength.  ?Neurological:  ?   Mental Status: He is alert and oriented to person, place, and time.  ?Psychiatric:     ?   Behavior: Behavior normal.  ? ? ?Vital signs in last 24  hours: ?  ? ?Labs: ? ? ?Estimated body mass index is 33.3 kg/m? as calculated from the following: ?  Height as of 09/12/21: '6\' 1"'$  (1.854 m). ?  Weight as of 09/12/21: 114.5 kg. ? ? ?Imaging Review ?Plain radiographs demonstrate severe degenerative joint disease of the left hip(s). The bone quality appears to be excellent for age and reported activity level. ? ? ? ? ? ?Assessment/Plan: ? ?End stage arthritis, left hip(s) ? ?The patient history, physical examination, clinical judgement of the provider and imaging studies are consistent with end stage degenerative joint disease of the left hip(s) and total hip arthroplasty is deemed medically necessary. The treatment options including medical management, injection therapy, arthroscopy and arthroplasty were discussed at length. The risks and benefits of total hip arthroplasty were presented and reviewed. The risks due to aseptic loosening, infection, stiffness, dislocation/subluxation,  thromboembolic complications and other imponderables were discussed.  The patient acknowledged the explanation, agreed to proceed with the plan and consent was signed. Patient is being admitted for inpatient treatment for surgery, pain control, PT, OT, prophylactic antibiotics, VTE prophylaxis, progressive ambulation and ADL's and discharge planning.The patient is planning to be discharged home with home health services ? ? ? ?

## 2021-09-22 NOTE — Anesthesia Preprocedure Evaluation (Addendum)
Anesthesia Evaluation  ?Patient identified by MRN, date of birth, ID band ?Patient awake ? ? ? ?Reviewed: ?Allergy & Precautions, NPO status , Patient's Chart, lab work & pertinent test results ? ?Airway ?Mallampati: II ? ?TM Distance: >3 FB ?Neck ROM: Full ? ? ? Dental ?no notable dental hx. ? ?  ?Pulmonary ?neg pulmonary ROS, former smoker,  ?  ?Pulmonary exam normal ?breath sounds clear to auscultation ? ? ? ? ? ? Cardiovascular ?negative cardio ROS ?Normal cardiovascular exam ?Rhythm:Regular Rate:Normal ? ? ?  ?Neuro/Psych ?negative neurological ROS ? negative psych ROS  ? GI/Hepatic ?negative GI ROS, Neg liver ROS,   ?Endo/Other  ?negative endocrine ROS ? Renal/GU ?negative Renal ROS  ?negative genitourinary ?  ?Musculoskeletal ? ?(+) Arthritis , Osteoarthritis,   ? Abdominal ?  ?Peds ?negative pediatric ROS ?(+)  Hematology ?negative hematology ROS ?(+)   ?Anesthesia Other Findings ? ? Reproductive/Obstetrics ?negative OB ROS ? ?  ? ? ? ? ? ? ? ? ? ? ? ? ? ?  ?  ? ? ? ? ? ? ? ?Anesthesia Physical ?Anesthesia Plan ? ?ASA: 2 ? ?Anesthesia Plan: Spinal  ? ?Post-op Pain Management: Ofirmev IV (intra-op)*  ? ?Induction: Intravenous ? ?PONV Risk Score and Plan: 2 and Ondansetron, Propofol infusion, Treatment may vary due to age or medical condition and Midazolam ? ?Airway Management Planned: Simple Face Mask ? ?Additional Equipment:  ? ?Intra-op Plan:  ? ?Post-operative Plan:  ? ?Informed Consent: I have reviewed the patients History and Physical, chart, labs and discussed the procedure including the risks, benefits and alternatives for the proposed anesthesia with the patient or authorized representative who has indicated his/her understanding and acceptance.  ? ? ? ?Dental advisory given ? ?Plan Discussed with: CRNA and Surgeon ? ?Anesthesia Plan Comments:   ? ? ? ? ? ? ?Anesthesia Quick Evaluation ? ?

## 2021-09-23 ENCOUNTER — Encounter (HOSPITAL_COMMUNITY): Payer: Self-pay | Admitting: Orthopaedic Surgery

## 2021-09-23 ENCOUNTER — Ambulatory Visit (HOSPITAL_COMMUNITY): Payer: 59

## 2021-09-23 ENCOUNTER — Observation Stay (HOSPITAL_COMMUNITY)
Admission: RE | Admit: 2021-09-23 | Discharge: 2021-09-24 | Disposition: A | Discharge status: Home Health | Payer: 59 | Source: Ambulatory Visit | Attending: Orthopaedic Surgery | Admitting: Orthopaedic Surgery

## 2021-09-23 ENCOUNTER — Observation Stay (HOSPITAL_COMMUNITY): Payer: 59

## 2021-09-23 ENCOUNTER — Ambulatory Visit (HOSPITAL_BASED_OUTPATIENT_CLINIC_OR_DEPARTMENT_OTHER): Payer: 59 | Admitting: Certified Registered"

## 2021-09-23 ENCOUNTER — Other Ambulatory Visit: Payer: Self-pay

## 2021-09-23 ENCOUNTER — Encounter (HOSPITAL_COMMUNITY)
Admission: RE | Disposition: A | Discharge status: Home Health | Payer: Self-pay | Source: Ambulatory Visit | Attending: Orthopaedic Surgery

## 2021-09-23 ENCOUNTER — Ambulatory Visit (HOSPITAL_COMMUNITY): Payer: 59 | Admitting: Certified Registered"

## 2021-09-23 DIAGNOSIS — M1612 Unilateral primary osteoarthritis, left hip: Secondary | ICD-10-CM

## 2021-09-23 DIAGNOSIS — Z96642 Presence of left artificial hip joint: Secondary | ICD-10-CM

## 2021-09-23 DIAGNOSIS — Z87891 Personal history of nicotine dependence: Secondary | ICD-10-CM | POA: Diagnosis not present

## 2021-09-23 HISTORY — PX: TOTAL HIP ARTHROPLASTY: SHX124

## 2021-09-23 LAB — ABO/RH: ABO/RH(D): O POS

## 2021-09-23 SURGERY — ARTHROPLASTY, HIP, TOTAL, ANTERIOR APPROACH
Anesthesia: Spinal | Site: Hip | Laterality: Left

## 2021-09-23 MED ORDER — METOCLOPRAMIDE HCL 5 MG/ML IJ SOLN: 5.0000 mg | Freq: Three times a day (TID) | INTRAMUSCULAR | Status: DC | PRN

## 2021-09-23 MED ORDER — ACETAMINOPHEN 10 MG/ML IV SOLN
INTRAVENOUS | Status: AC
  Filled 2021-09-23: qty 100

## 2021-09-23 MED ORDER — DOCUSATE SODIUM 100 MG PO CAPS
100.0000 mg | ORAL_CAPSULE | Freq: Two times a day (BID) | ORAL | Status: DC
  Administered 2021-09-23 – 2021-09-24 (×2): 100 mg via ORAL
  Filled 2021-09-23 (×2): qty 1

## 2021-09-23 MED ORDER — ONDANSETRON HCL 4 MG/2ML IJ SOLN
INTRAMUSCULAR | Status: AC
  Filled 2021-09-23: qty 2

## 2021-09-23 MED ORDER — CEFAZOLIN SODIUM-DEXTROSE 1-4 GM/50ML-% IV SOLN
1.0000 g | Freq: Four times a day (QID) | INTRAVENOUS | Status: AC
  Administered 2021-09-23 (×2): 1 g via INTRAVENOUS
  Filled 2021-09-23 (×2): qty 50

## 2021-09-23 MED ORDER — DIPHENHYDRAMINE HCL 12.5 MG/5ML PO ELIX: 12.5000 mg | ORAL_SOLUTION | ORAL | Status: DC | PRN

## 2021-09-23 MED ORDER — PHENOL 1.4 % MT LIQD: 1.0000 | OROMUCOSAL | Status: DC | PRN

## 2021-09-23 MED ORDER — CEFAZOLIN SODIUM-DEXTROSE 2-4 GM/100ML-% IV SOLN
2.0000 g | INTRAVENOUS | Status: AC
  Administered 2021-09-23: 2 g via INTRAVENOUS
  Filled 2021-09-23: qty 100

## 2021-09-23 MED ORDER — POVIDONE-IODINE 10 % EX SWAB
2.0000 "application " | Freq: Once | CUTANEOUS | Status: AC
  Administered 2021-09-23: 2 via TOPICAL

## 2021-09-23 MED ORDER — OXYCODONE HCL 5 MG PO TABS
10.0000 mg | ORAL_TABLET | ORAL | Status: DC | PRN
  Administered 2021-09-23 – 2021-09-24 (×3): 10 mg via ORAL
  Filled 2021-09-23 (×3): qty 2

## 2021-09-23 MED ORDER — FENTANYL CITRATE (PF) 100 MCG/2ML IJ SOLN
INTRAMUSCULAR | Status: AC
  Filled 2021-09-23: qty 2

## 2021-09-23 MED ORDER — PANTOPRAZOLE SODIUM 40 MG PO TBEC
40.0000 mg | DELAYED_RELEASE_TABLET | Freq: Every day | ORAL | Status: DC
  Administered 2021-09-23 – 2021-09-24 (×2): 40 mg via ORAL
  Filled 2021-09-23 (×2): qty 1

## 2021-09-23 MED ORDER — EPHEDRINE 5 MG/ML INJ
INTRAVENOUS | Status: AC
  Filled 2021-09-23: qty 5

## 2021-09-23 MED ORDER — VITAMIN D3 25 MCG (1000 UNIT) PO TABS
10000.0000 [IU] | ORAL_TABLET | Freq: Every day | ORAL | Status: DC
  Administered 2021-09-23: 10000 [IU] via ORAL
  Filled 2021-09-23 (×2): qty 10

## 2021-09-23 MED ORDER — METHOCARBAMOL 500 MG IVPB - SIMPLE MED
500.0000 mg | Freq: Four times a day (QID) | INTRAVENOUS | Status: DC | PRN
  Filled 2021-09-23: qty 50

## 2021-09-23 MED ORDER — METHOCARBAMOL 500 MG PO TABS
500.0000 mg | ORAL_TABLET | Freq: Four times a day (QID) | ORAL | Status: DC | PRN
  Administered 2021-09-23 (×2): 500 mg via ORAL
  Filled 2021-09-23 (×2): qty 1

## 2021-09-23 MED ORDER — HYDROMORPHONE HCL 1 MG/ML IJ SOLN
0.5000 mg | INTRAMUSCULAR | Status: DC | PRN
  Administered 2021-09-23: 1 mg via INTRAVENOUS
  Filled 2021-09-23: qty 1

## 2021-09-23 MED ORDER — PROPOFOL 10 MG/ML IV BOLUS
INTRAVENOUS | Status: AC
  Filled 2021-09-23: qty 20

## 2021-09-23 MED ORDER — MIDAZOLAM HCL 2 MG/2ML IJ SOLN
INTRAMUSCULAR | Status: AC
  Filled 2021-09-23: qty 2

## 2021-09-23 MED ORDER — ORAL CARE MOUTH RINSE: 15.0000 mL | Freq: Once | OROMUCOSAL | Status: AC

## 2021-09-23 MED ORDER — MENTHOL 3 MG MT LOZG: 1.0000 | LOZENGE | OROMUCOSAL | Status: DC | PRN

## 2021-09-23 MED ORDER — DEXAMETHASONE SODIUM PHOSPHATE 10 MG/ML IJ SOLN
INTRAMUSCULAR | Status: DC | PRN
  Administered 2021-09-23: 8 mg via INTRAVENOUS

## 2021-09-23 MED ORDER — ALUM & MAG HYDROXIDE-SIMETH 200-200-20 MG/5ML PO SUSP: 30.0000 mL | ORAL | Status: DC | PRN

## 2021-09-23 MED ORDER — METOCLOPRAMIDE HCL 5 MG PO TABS: 5.0000 mg | ORAL_TABLET | Freq: Three times a day (TID) | ORAL | Status: DC | PRN

## 2021-09-23 MED ORDER — KETOROLAC TROMETHAMINE 15 MG/ML IJ SOLN
7.5000 mg | Freq: Four times a day (QID) | INTRAMUSCULAR | Status: AC
Start: 2021-09-23 — End: 2021-09-24
  Administered 2021-09-23 – 2021-09-24 (×4): 7.5 mg via INTRAVENOUS
  Filled 2021-09-23 (×4): qty 1

## 2021-09-23 MED ORDER — BUPIVACAINE IN DEXTROSE 0.75-8.25 % IT SOLN
INTRATHECAL | Status: DC | PRN
  Administered 2021-09-23: 2 mL via INTRATHECAL

## 2021-09-23 MED ORDER — PROPOFOL 10 MG/ML IV BOLUS
INTRAVENOUS | Status: DC | PRN
Start: 2021-09-23 — End: 2021-09-23
  Administered 2021-09-23: 20 mg via INTRAVENOUS

## 2021-09-23 MED ORDER — EPHEDRINE SULFATE-NACL 50-0.9 MG/10ML-% IV SOSY
PREFILLED_SYRINGE | INTRAVENOUS | Status: DC | PRN
Start: 2021-09-23 — End: 2021-09-23
  Administered 2021-09-23: 10 mg via INTRAVENOUS
  Administered 2021-09-23: 5 mg via INTRAVENOUS
  Administered 2021-09-23: 10 mg via INTRAVENOUS

## 2021-09-23 MED ORDER — ONDANSETRON HCL 4 MG/2ML IJ SOLN
INTRAMUSCULAR | Status: DC | PRN
  Administered 2021-09-23: 4 mg via INTRAVENOUS

## 2021-09-23 MED ORDER — 0.9 % SODIUM CHLORIDE (POUR BTL) OPTIME
TOPICAL | Status: DC | PRN
  Administered 2021-09-23: 1000 mL

## 2021-09-23 MED ORDER — PHENYLEPHRINE HCL-NACL 20-0.9 MG/250ML-% IV SOLN
INTRAVENOUS | Status: DC | PRN
  Administered 2021-09-23: 20 ug/min via INTRAVENOUS

## 2021-09-23 MED ORDER — ACETAMINOPHEN 10 MG/ML IV SOLN: 1000.0000 mg | Freq: Once | INTRAVENOUS | Status: DC | PRN

## 2021-09-23 MED ORDER — OXYCODONE HCL 5 MG PO TABS
5.0000 mg | ORAL_TABLET | ORAL | Status: DC | PRN
  Administered 2021-09-23 (×2): 10 mg via ORAL
  Filled 2021-09-23 (×2): qty 2

## 2021-09-23 MED ORDER — ASPIRIN 81 MG PO CHEW
81.0000 mg | CHEWABLE_TABLET | Freq: Two times a day (BID) | ORAL | Status: DC
  Administered 2021-09-23 – 2021-09-24 (×2): 81 mg via ORAL
  Filled 2021-09-23 (×2): qty 1

## 2021-09-23 MED ORDER — PROPOFOL 1000 MG/100ML IV EMUL
INTRAVENOUS | Status: AC
  Filled 2021-09-23: qty 100

## 2021-09-23 MED ORDER — SODIUM CHLORIDE 0.9 % IV SOLN: INTRAVENOUS | Status: DC

## 2021-09-23 MED ORDER — FENTANYL CITRATE (PF) 100 MCG/2ML IJ SOLN
INTRAMUSCULAR | Status: DC | PRN
  Administered 2021-09-23: 100 ug via INTRAVENOUS

## 2021-09-23 MED ORDER — DEXAMETHASONE SODIUM PHOSPHATE 10 MG/ML IJ SOLN
INTRAMUSCULAR | Status: AC
  Filled 2021-09-23: qty 1

## 2021-09-23 MED ORDER — ACETAMINOPHEN 325 MG PO TABS: 325.0000 mg | ORAL_TABLET | Freq: Four times a day (QID) | ORAL | Status: DC | PRN

## 2021-09-23 MED ORDER — TRANEXAMIC ACID-NACL 1000-0.7 MG/100ML-% IV SOLN
1000.0000 mg | INTRAVENOUS | Status: AC
  Administered 2021-09-23: 1000 mg via INTRAVENOUS
  Filled 2021-09-23: qty 100

## 2021-09-23 MED ORDER — LIDOCAINE HCL (PF) 2 % IJ SOLN
INTRAMUSCULAR | Status: AC
  Filled 2021-09-23: qty 5

## 2021-09-23 MED ORDER — ONDANSETRON HCL 4 MG/2ML IJ SOLN: 4.0000 mg | Freq: Once | INTRAMUSCULAR | Status: DC | PRN

## 2021-09-23 MED ORDER — LIDOCAINE 2% (20 MG/ML) 5 ML SYRINGE
INTRAMUSCULAR | Status: DC | PRN
  Administered 2021-09-23: 40 mg via INTRAVENOUS

## 2021-09-23 MED ORDER — PROPOFOL 500 MG/50ML IV EMUL
INTRAVENOUS | Status: DC | PRN
  Administered 2021-09-23: 60 ug/kg/min via INTRAVENOUS

## 2021-09-23 MED ORDER — ONDANSETRON HCL 4 MG PO TABS: 4.0000 mg | ORAL_TABLET | Freq: Four times a day (QID) | ORAL | Status: DC | PRN

## 2021-09-23 MED ORDER — CHLORHEXIDINE GLUCONATE 0.12 % MT SOLN
15.0000 mL | Freq: Once | OROMUCOSAL | Status: AC
  Administered 2021-09-23: 15 mL via OROMUCOSAL

## 2021-09-23 MED ORDER — ACETAMINOPHEN 10 MG/ML IV SOLN
INTRAVENOUS | Status: DC | PRN
  Administered 2021-09-23: 1000 mg via INTRAVENOUS

## 2021-09-23 MED ORDER — HYDROMORPHONE HCL 1 MG/ML IJ SOLN: 0.2500 mg | INTRAMUSCULAR | Status: DC | PRN

## 2021-09-23 MED ORDER — STERILE WATER FOR IRRIGATION IR SOLN
Status: DC | PRN
  Administered 2021-09-23: 2000 mL

## 2021-09-23 MED ORDER — ONDANSETRON HCL 4 MG/2ML IJ SOLN: 4.0000 mg | Freq: Four times a day (QID) | INTRAMUSCULAR | Status: DC | PRN

## 2021-09-23 MED ORDER — LACTATED RINGERS IV SOLN: INTRAVENOUS | Status: DC

## 2021-09-23 MED ORDER — MIDAZOLAM HCL 2 MG/2ML IJ SOLN
INTRAMUSCULAR | Status: DC | PRN
  Administered 2021-09-23: 2 mg via INTRAVENOUS

## 2021-09-23 SURGICAL SUPPLY — 44 items
ACETAB CUP W/GRIPTION 54 (Plate) ×2 IMPLANT
BAG COUNTER SPONGE SURGICOUNT (BAG) ×2 IMPLANT
BAG ZIPLOCK 12X15 (MISCELLANEOUS) IMPLANT
BENZOIN TINCTURE PRP APPL 2/3 (GAUZE/BANDAGES/DRESSINGS) IMPLANT
BLADE SAW SGTL 18X1.27X75 (BLADE) ×2 IMPLANT
COVER PERINEAL POST (MISCELLANEOUS) ×2 IMPLANT
COVER SURGICAL LIGHT HANDLE (MISCELLANEOUS) ×2 IMPLANT
CUP ACETAB W/GRIPTION 54 (Plate) IMPLANT
DRAPE FOOT SWITCH (DRAPES) ×2 IMPLANT
DRAPE IMP U-DRAPE 54X76 (DRAPES) ×2 IMPLANT
DRAPE STERI IOBAN 125X83 (DRAPES) ×2 IMPLANT
DRAPE U-SHAPE 47X51 STRL (DRAPES) ×4 IMPLANT
DRESSING AQUACEL AG SP 3.5X10 (GAUZE/BANDAGES/DRESSINGS) IMPLANT
DRSG AQUACEL AG ADV 3.5X10 (GAUZE/BANDAGES/DRESSINGS) ×2 IMPLANT
DRSG AQUACEL AG SP 3.5X10 (GAUZE/BANDAGES/DRESSINGS) ×2
DURAPREP 26ML APPLICATOR (WOUND CARE) ×2 IMPLANT
ELECT REM PT RETURN 15FT ADLT (MISCELLANEOUS) ×2 IMPLANT
GAUZE XEROFORM 1X8 LF (GAUZE/BANDAGES/DRESSINGS) ×2 IMPLANT
GLOVE SRG 8 PF TXTR STRL LF DI (GLOVE) ×2 IMPLANT
GLOVE SURG ENC MOIS LTX SZ7.5 (GLOVE) ×2 IMPLANT
GLOVE SURG NEOPR MICRO LF SZ8 (GLOVE) ×2 IMPLANT
GLOVE SURG UNDER POLY LF SZ8 (GLOVE) ×4
GOWN STRL REUS W/ TWL XL LVL3 (GOWN DISPOSABLE) ×2 IMPLANT
GOWN STRL REUS W/TWL XL LVL3 (GOWN DISPOSABLE) ×4
HANDPIECE INTERPULSE COAX TIP (DISPOSABLE) ×2
HEAD CERAMIC DELTA 36 PLUS 1.5 (Hips) ×1 IMPLANT
HOLDER FOLEY CATH W/STRAP (MISCELLANEOUS) ×2 IMPLANT
KIT TURNOVER KIT A (KITS) IMPLANT
LINER NEUTRAL 54X36MM PLUS 4 (Hips) ×1 IMPLANT
PACK ANTERIOR HIP CUSTOM (KITS) ×2 IMPLANT
SET HNDPC FAN SPRY TIP SCT (DISPOSABLE) ×1 IMPLANT
SPONGE T-LAP 18X18 ~~LOC~~+RFID (SPONGE) ×6 IMPLANT
STAPLER VISISTAT 35W (STAPLE) IMPLANT
STEM FEMORAL SZ5 HIGH ACTIS (Stem) ×1 IMPLANT
STRIP CLOSURE SKIN 1/2X4 (GAUZE/BANDAGES/DRESSINGS) IMPLANT
SUT ETHIBOND NAB CT1 #1 30IN (SUTURE) ×2 IMPLANT
SUT ETHILON 2 0 PS N (SUTURE) IMPLANT
SUT MNCRL AB 4-0 PS2 18 (SUTURE) IMPLANT
SUT VIC AB 0 CT1 36 (SUTURE) ×2 IMPLANT
SUT VIC AB 1 CT1 36 (SUTURE) ×2 IMPLANT
SUT VIC AB 2-0 CT1 27 (SUTURE) ×4
SUT VIC AB 2-0 CT1 TAPERPNT 27 (SUTURE) ×2 IMPLANT
TRAY FOLEY MTR SLVR 16FR STAT (SET/KITS/TRAYS/PACK) IMPLANT
YANKAUER SUCT BULB TIP NO VENT (SUCTIONS) ×2 IMPLANT

## 2021-09-23 NOTE — Anesthesia Postprocedure Evaluation (Signed)
Anesthesia Post Note ? ?Patient: Timothy Newman ? ?Procedure(s) Performed: Left TOTAL HIP ARTHROPLASTY ANTERIOR APPROACH (Left: Hip) ? ?  ? ?Patient location during evaluation: PACU ?Anesthesia Type: Spinal ?Level of consciousness: oriented and awake and alert ?Pain management: pain level controlled ?Vital Signs Assessment: post-procedure vital signs reviewed and stable ?Respiratory status: spontaneous breathing, respiratory function stable and patient connected to nasal cannula oxygen ?Cardiovascular status: blood pressure returned to baseline and stable ?Postop Assessment: no headache, no backache and no apparent nausea or vomiting ?Anesthetic complications: no ? ? ?No notable events documented. ? ?Last Vitals:  ?Vitals:  ? 09/23/21 0930 09/23/21 0945  ?BP: 96/63 98/65  ?Pulse: (!) 51 (!) 53  ?Resp: (!) 8 11  ?Temp:    ?SpO2: 98% 99%  ?  ?Last Pain:  ?Vitals:  ? 09/23/21 0930  ?TempSrc:   ?PainSc: 0-No pain  ? ? ?  ?  ?  ?  ?  ?  ? ?Miho Monda S ? ? ? ? ?

## 2021-09-23 NOTE — Anesthesia Procedure Notes (Signed)
Procedure Name: [place] ?Date/Time: 09/23/2021 7:07 AM ?Performed by: Eben Burow, CRNA ?Pre-anesthesia Checklist: Patient identified, Emergency Drugs available, Suction available, Patient being monitored and Timeout performed ?Oxygen Delivery Method: Simple face mask ?Placement Confirmation: positive ETCO2 ? ? ? ? ?

## 2021-09-23 NOTE — Brief Op Note (Signed)
09/23/2021 ? ?8:12 AM ? ?PATIENT:  CAELIN RAYL  51 y.o. male ? ?PRE-OPERATIVE DIAGNOSIS:  Osteoarthritis / Degenerative joint diease left hip ? ?POST-OPERATIVE DIAGNOSIS:  Osteoarthritis / Degenerative joint diease left hip ? ?PROCEDURE:  Procedure(s): ?Left TOTAL HIP ARTHROPLASTY ANTERIOR APPROACH (Left) ? ?SURGEON:  Surgeon(s) and Role: ?   Mcarthur Rossetti, MD - Primary ? ?PHYSICIAN ASSISTANT:  Benita Stabile, PA-C ? ?ANESTHESIA:   spinal ? ?EBL:  250 mL  ? ?COUNTS:  YES ? ?DICTATION: .Other Dictation: Dictation Number [id number] ? ?PLAN OF CARE: Admit for overnight observation ? ?PATIENT DISPOSITION:  PACU - hemodynamically stable. ?  ?Delay start of Pharmacological VTE agent (>24hrs) due to surgical blood loss or risk of bleeding: no ? ?

## 2021-09-23 NOTE — Interval H&P Note (Signed)
History and Physical Interval Note: The patient understands that he is here today for a left hip replacement to treat his left hip osteoarthritis.  There has been no acute or interval changes to medical status.  Please see recent H&P.  The risks and benefits of surgery been explained in detail and informed consent is obtained.  The left operative hip has been marked. ? ?09/23/2021 ?7:02 AM ? ?Timothy Newman  has presented today for surgery, with the diagnosis of Osteoarthritis / Degenerative joint diease left hip.  The various methods of treatment have been discussed with the patient and family. After consideration of risks, benefits and other options for treatment, the patient has consented to  Procedure(s): ?Left TOTAL HIP ARTHROPLASTY ANTERIOR APPROACH (Left) as a surgical intervention.  The patient's history has been reviewed, patient examined, no change in status, stable for surgery.  I have reviewed the patient's chart and labs.  Questions were answered to the patient's satisfaction.   ? ? ?Mcarthur Rossetti ? ? ?

## 2021-09-23 NOTE — Evaluation (Signed)
Physical Therapy Evaluation ?Patient Details ?Name: Timothy Newman ?MRN: [id number] ?DOB: 11/02/1970 ?Today's Date: 09/23/2021 ? ?History of Present Illness ? Pt is a 51yo male presenting s/p L-THA, anterior approach on 09/23/21. PMH: OA.  ?Clinical Impression ? Timothy Newman is a 51 y.o. male POD 0 s/p  L-THA, anterior approach. Patient reports independence with mobility at baseline. Patient is now limited by functional impairments (see PT problem list below) and requires min assist for bed mobility as well as min guard for transfers and gait with RW for 8 feet. Patient instructed in exercise to facilitate ROM and circulation to manage edema. Patient will benefit from continued skilled PT interventions to address impairments and progress towards PLOF. Acute PT will follow to progress mobility and stair training in preparation for safe discharge home. ?   ?   ? ?Recommendations for follow up therapy are one component of a multi-disciplinary discharge planning process, led by the attending physician.  Recommendations may be updated based on patient status, additional functional criteria and insurance authorization. ? ?Follow Up Recommendations Follow physician's recommendations for discharge plan and follow up therapies ? ?  ?Assistance Recommended at Discharge Set up Supervision/Assistance  ?Patient can return home with the following ? A little help with walking and/or transfers;A little help with bathing/dressing/bathroom;Assist for transportation;Help with stairs or ramp for entrance;Assistance with cooking/housework ? ?  ?Equipment Recommendations None recommended by PT (Pt has recommended DME)  ?Recommendations for Other Services ?    ?  ?Functional Status Assessment Patient has had a recent decline in their functional status and demonstrates the ability to make significant improvements in function in a reasonable and predictable amount of time.  ? ?  ?Precautions / Restrictions Precautions ?Precautions:  Fall ?Restrictions ?Weight Bearing Restrictions: Yes ?LLE Weight Bearing: Weight bearing as tolerated  ? ?  ? ?Mobility ? Bed Mobility ?Overal bed mobility: Needs Assistance ?Bed Mobility: Supine to Sit ?  ?  ?Supine to sit: Min assist ?  ?  ?General bed mobility comments: Pt required min assist for LLE advancement off bed, otherwise min guard for safety and line management. ?  ? ?Transfers ?Overall transfer level: Needs assistance ?Equipment used: Rolling walker (2 wheels) ?Transfers: Sit to/from Stand ?Sit to Stand: Min guard ?  ?  ?  ?  ?  ?General transfer comment: Pt min guard for safety only, no physical assist required. Multimodal cuing for hand placement and sequencing. ?  ? ?Ambulation/Gait ?Ambulation/Gait assistance: Min guard ?Gait Distance (Feet): 10 Feet ?Assistive device: Rolling walker (2 wheels) ?Gait Pattern/deviations: Step-to pattern, Decreased step length - left, Decreased stance time - left, Decreased weight shift to left ?  ?  ?  ?General Gait Details: Pt ambulated in room with min guard assist for safety and cuing, no physical assist required, recliner follow. Demonstrated decreased stance time on L with grimacing secondary to pain. No overt LOB ? ?Stairs ?  ?  ?  ?  ?  ? ?Wheelchair Mobility ?  ? ?Modified Rankin (Stroke Patients Only) ?  ? ?  ? ?Balance Overall balance assessment: Needs assistance ?Sitting-balance support: Feet unsupported, No upper extremity supported ?Sitting balance-Leahy Scale: Fair ?  ?  ?Standing balance support: Bilateral upper extremity supported, During functional activity, Reliant on assistive device for balance ?Standing balance-Leahy Scale: Poor ?  ?  ?  ?  ?  ?  ?  ?  ?  ?  ?  ?  ?   ? ? ? ?Pertinent Vitals/Pain  Pain Assessment ?Pain Assessment: 0-10 ?Pain Score: 3  ?Pain Location: left hip ?Pain Descriptors / Indicators: Operative site guarding, Discomfort ?Pain Intervention(s): Limited activity within patient's tolerance, Monitored during session,  Repositioned  ? ? ?Home Living Family/patient expects to be discharged to:: Private residence ?Living Arrangements: Spouse/significant other;Children ?Available Help at Discharge: Family;Available 24 hours/day ?Type of Home: House ?Home Access: Stairs to enter ?Entrance Stairs-Rails: Right ?Entrance Stairs-Number of Steps: 3 ?Alternate Level Stairs-Number of Steps: 14 ?Home Layout: Able to live on main level with bedroom/bathroom;Two level ?Home Equipment: Animator (2 wheels);Rollator (4 wheels);BSC/3in1 ?   ?  ?Prior Function Prior Level of Function : Independent/Modified Independent ?  ?  ?  ?  ?  ?  ?Mobility Comments: IND ?ADLs Comments: IND ?  ? ? ?Hand Dominance  ? Dominant Hand: Right ? ?  ?Extremity/Trunk Assessment  ? Upper Extremity Assessment ?Upper Extremity Assessment: Overall WFL for tasks assessed ?  ? ?Lower Extremity Assessment ?Lower Extremity Assessment: RLE deficits/detail;LLE deficits/detail ?RLE Deficits / Details: MMT: ank PF/DF 5/5 ?RLE Sensation: WNL ?LLE Deficits / Details: MMT: ank PF/DF 5/5 ?LLE Sensation: decreased light touch (reduced light touch in buttock L2-L3) ?  ? ?Cervical / Trunk Assessment ?Cervical / Trunk Assessment: Normal  ?Communication  ? Communication: No difficulties  ?Cognition Arousal/Alertness: Awake/alert ?Behavior During Therapy: [facility] for tasks assessed/performed ?Overall Cognitive Status: Within Functional Limits for tasks assessed ?  ?  ?  ?  ?  ?  ?  ?  ?  ?  ?  ?  ?  ?  ?  ?  ?  ?  ?  ? ?  ?General Comments   ? ?  ?Exercises Total Joint Exercises ?Ankle Circles/Pumps: AROM, 20 reps, Both ?Quad Sets: AROM, Left, 5 reps  ? ?Assessment/Plan  ?  ?PT Assessment Patient needs continued PT services  ?PT Problem List Decreased strength;Decreased range of motion;Decreased activity tolerance;Decreased balance;Decreased mobility;Decreased coordination;Decreased knowledge of use of DME;Pain ? ?   ?  ?PT Treatment Interventions DME instruction;Gait  training;Stair training;Functional mobility training;Therapeutic activities;Therapeutic exercise;Balance training;Neuromuscular re-education;Patient/family education   ? ?PT Goals (Current goals can be found in the Care Plan section)  ?Acute Rehab PT Goals ?Patient Stated Goal: Put on socks myself ?PT Goal Formulation: With patient ?Time For Goal Achievement: 09/30/21 ?Potential to Achieve Goals: Good ? ?  ?Frequency 7X/week ?  ? ? ?Co-evaluation   ?  ?  ?  ?  ? ? ?  ?AM-PAC PT "6 Clicks" Mobility  ?Outcome Measure Help needed turning from your back to your side while in a flat bed without using bedrails?: A Little ?Help needed moving from lying on your back to sitting on the side of a flat bed without using bedrails?: A Little ?Help needed moving to and from a bed to a chair (including a wheelchair)?: A Little ?Help needed standing up from a chair using your arms (e.g., wheelchair or bedside chair)?: A Little ?Help needed to walk in hospital room?: A Little ?Help needed climbing 3-5 steps with a railing? : A Little ?6 Click Score: 18 ? ?  ?End of Session Equipment Utilized During Treatment: Gait belt ?Activity Tolerance: Patient tolerated treatment well ?Patient left: in chair;with call bell/phone within reach;with chair alarm set;with family/visitor present ?Nurse Communication: Mobility status ?PT Visit Diagnosis: Pain;Difficulty in walking, not elsewhere classified (R26.2) ?Pain - Right/Left: Left ?Pain - part of body: Hip ?  ? ?Time: 1829-9371 ?PT Time Calculation (min) (ACUTE ONLY): 33 min ? ? ?  Charges:   PT Evaluation ?$PT Eval Low Complexity: 1 Low ?PT Treatments ?$Gait Training: 8-22 mins ?  ?   ? ? ?Coolidge Breeze, PT, DPT ?[facility] ?Office: [phone] ?Pager: [phone] ? ?Coolidge Breeze ?09/23/2021, 3:08 PM ? ?

## 2021-09-23 NOTE — Anesthesia Procedure Notes (Signed)
Spinal ? ?Patient location during procedure: OR ?Start time: 09/23/2021 7:06 AM ?End time: 09/23/2021 7:09 AM ?Reason for block: surgical anesthesia ?Staffing ?Performed: anesthesiologist  ?Anesthesiologist: Myrtie Soman, MD ?Preanesthetic Checklist ?Completed: patient identified, IV checked, site marked, risks and benefits discussed, surgical consent, monitors and equipment checked, pre-op evaluation and timeout performed ?Spinal Block ?Patient position: sitting ?Prep: Betadine ?Patient monitoring: heart rate, continuous pulse ox and blood pressure ?Approach: midline ?Location: L3-4 ?Injection technique: single-shot ?Needle ?Needle type: Sprotte  ?Needle gauge: 24 G ?Needle length: 9 cm ?Assessment ?Sensory level: T6 ?Events: CSF return ?Additional Notes ? ? ? ? ? ? ?

## 2021-09-23 NOTE — Plan of Care (Signed)
?  Problem: Education: ?Goal: Knowledge of General Education information will improve ?Description: Including pain rating scale, medication(s)/side effects and non-pharmacologic comfort measures ?Outcome: Progressing ?  ?Problem: Coping: ?Goal: Level of anxiety will decrease ?Outcome: Progressing ?  ?Problem: Safety: ?Goal: Ability to remain free from injury will improve ?Outcome: Progressing ?  ?Problem: Activity: ?Goal: Ability to avoid complications of mobility impairment will improve ?Outcome: Progressing ?  ?

## 2021-09-23 NOTE — Op Note (Signed)
NAME: Timothy Newman, Timothy Newman. ?MEDICAL RECORD NO: [id number] ?ACCOUNT NO: [id number] ?DATE OF BIRTH: [birth year] ?FACILITY: [facility] ?LOCATION: [facility] ?PHYSICIAN: Lind Guest. Ninfa Linden, MD ? ?Operative Report  ? ?DATE OF PROCEDURE: 09/23/2021 ? ?PREOPERATIVE DIAGNOSIS:  Primary osteoarthritis and degenerative joint disease, left hip. ? ?POSTOPERATIVE DIAGNOSIS:  Primary osteoarthritis and degenerative joint disease, left hip. ? ?PROCEDURE:  Left total hip arthroplasty through direct anterior approach. ? ?IMPLANTS:  DePuy sector Gription acetabular component, size 54, size 36+4 neutral polyethylene liner, size 5 ACTIS femoral component with high offset, size 36+1.5 ceramic hip ball. ? ?SURGEON:  Lind Guest. Ninfa Linden, MD ? ?ASSISTANT:  Benita Stabile, PA-C. ? ?ANESTHESIA:  Spinal. ? ?ANTIBIOTICS:  2 g IV Ancef. ? ?ESTIMATED BLOOD LOSS:  200 mL ? ?COMPLICATIONS:  None. ? ?INDICATIONS:  The patient is a 51 year old gentleman with debilitating arthritis involving his left hip and getting worse for several years now.  He has had a series of intra-articular steroid injections, left hip. At this point, his left hip pain is  ?daily and it is detrimentally affecting his mobility, his quality of life and activities of daily living to the point we have recommended a total hip arthroplasty and he does wish to proceed with this as well.  He is young, but his hip arthritis is  ?significant confirmed on plain films and clinical exam.  We did talk about the risk of acute blood loss anemia, nerve or vessel injury, fracture, infection, dislocation, DVT, implant failure, leg length differences and skin and soft tissue issues.  We  ?talked about our goals being decreased pain, improve mobility and overall improve quality of life. ? ?DESCRIPTION OF PROCEDURE:  After informed consent was obtained, appropriate left hip was marked, he was brought to the operating room and sat up on the stretcher where spinal anesthesia was obtained.  He was laid in  supine position on the stretcher.   ?Foley catheter was placed.  I was able to assess his leg lengths.  He is shorter on his left side, even though his standing films and intraoperative films make it look like he is longer, he is shorter, so we will definitely lengthen him.  Traction boots  ?were placed on both his feet.  Next, he was placed supine on the Hana fracture table, the perineal post in place and both legs in line skeletal traction device and no traction applied.  His left operative hip was prepped and draped with DuraPrep and  ?sterile drapes.  A timeout was called and he was identified correct patient, correct left hip.  I then made an incision just inferior and posterior to the anterior superior iliac spine and carried this obliquely down the leg.  We dissected down to the  ?tensor fascia lata muscle, the tensor fascia was then divided longitudinally to proceed with direct anterior approach to the hip.  We identified and cauterized circumflex vessels and then identified the hip capsule, opened the hip capsule, finding a  ?moderate joint effusion.  We placed curved retractors around the medial and lateral femoral neck and made a femoral neck cut with an oscillating saw, proximal to the lesser trochanter and completed this with an osteotome.  We placed a corkscrew guide in  ?the femoral head and removed the femoral head in its entirety and found a wide area devoid of cartilage.  I then placed a bent Hohmann over the medial acetabular rim and removed remnants of acetabular labrum and other debris.  I then began reaming under  ?direct visualization  from a size 43 reamer in a stepwise increments going up to a size 53 reamer with all reamers placed under direct visualization.  Last reamer was also placed under direct fluoroscopy, so I could obtain my depth of reaming my  ?inclination and anteversion.  I then placed the real DePuy Sector Gription acetabular component, size 54 and we went with a 36+4 neutral  polyethylene liner based on the patient'Newman offset.  Attention was then turned to the femur.  With the leg externally  ?rotated to 120 degrees and extended and adducted, we were able to place a Mueller retractor medially and Hohman retractor behind the greater trochanter, we released lateral joint capsule and used a box cutting osteotome to enter femoral canal and a  ?rongeur to lateralize, we then began broaching using the ACTIS broaching system from a size 0 going to a size 5.  With size 5 in place, we trialed a high offset femoral neck based off his anatomy and a 36+1.5 trial hip ball, we reduced this in the  ?acetabulum.  We were pleased with leg length, offset, range of motion and stability assessed radiographically and mechanically.  We then dislocated the hip and removed the trial components.  We placed the real ACTIS femoral component, size 5 with high  ?offset and the real 36+1.5 ceramic hip ball and again reduced this in acetabulum.  We appreciated the stability.  We then irrigated the soft tissue with normal saline solution.  We assessed the hip again radiographically and then closed the hip capsule  ?with interrupted #1 Ethibond suture followed by #1 Vicryl to close the tensor fascia.  0 Vicryl was used to close the deep tissue and 2-0 Vicryl was used to close the subcutaneous tissue and skin was closed with staples.  Aquacel dressing was applied.   ?He was taken off the Hana table and taken to recovery room in stable condition with all final counts being correct, there were no complications noted.  Of note, Benita Stabile, PA-C, assisted during the entire case and his assistance was crucial for helping  ?manage soft tissues and retract soft tissues as well as appropriate placement of the implants and layered closure of the wound.  His assistance was medically necessary.  ? ? ? ?[place] ?D: 09/23/2021 8:10:36 am T: 09/23/2021 9:35:00 am  ?JOB: [id number]/ [id number]  ?

## 2021-09-23 NOTE — Transfer of Care (Signed)
Immediate Anesthesia Transfer of Care Note ? ?Patient: Timothy Newman ? ?Procedure(s) Performed: Left TOTAL HIP ARTHROPLASTY ANTERIOR APPROACH (Left: Hip) ? ?Patient Location: PACU ? ?Anesthesia Type:Spinal ? ?Level of Consciousness: awake, alert  and patient cooperative ? ?Airway & Oxygen Therapy: Patient Spontanous Breathing and Patient connected to face mask oxygen ? ?Post-op Assessment: Report given to RN and Post -op Vital signs reviewed and stable ? ?Post vital signs: Reviewed and stable ? ?Last Vitals:  ?Vitals Value Taken Time  ?BP 92/51 09/23/21 0830  ?Temp    ?Pulse 70 09/23/21 0831  ?Resp 16 09/23/21 0831  ?SpO2 100 % 09/23/21 0831  ?Vitals shown include unvalidated device data. ? ?Last Pain:  ?Vitals:  ? 09/23/21 0550  ?TempSrc: Oral  ?   ? ?  ? ?Complications: No notable events documented. ?

## 2021-09-23 NOTE — TOC Transition Note (Signed)
Transition of Care (TOC) - CM/SW Discharge Note ? ? ?Patient Details  ?Name: BILL YOHN ?MRN: [id number] ?Date of Birth: 02-Jan-1971 ? ?Transition of Care (TOC) CM/SW Contact:  ?Kaarin Pardy, LCSW ?Phone Number: ?09/23/2021, 2:59 PM ? ? ?Clinical Narrative:    ?Met with pt and spouse and confirming pt has all needed DME at home.  They are aware of orders for HHPT and agreeable with no agency preference.  Referral placed with [facility] who can begin visits on Monday.  No further TOC needs. ? ? ?Final next level of care: [facility] ?Barriers to Discharge: No Barriers Identified ? ? ?Patient Goals and CMS Choice ?Patient states their goals for this hospitalization and ongoing recovery are:: return home ?  ?  ? ?Discharge Placement ?  ?           ?  ?  ?  ?  ? ?Discharge Plan and Services ?  ?  ?           ?DME Arranged: N/A ?DME Agency: NA ?  ?  ?  ?HH Arranged: PT ?[facility] Agency: [facility] ?Date HH Agency Contacted: 09/23/21 ?Time [place]: 7445 ?Representative spoke with at [place]: Tillie Rung ? ?Social Determinants of Health (SDOH) Interventions ?  ? ? ?Readmission Risk Interventions ?   ? View : No data to display.  ?  ?  ?  ? ? ? ? ? ?

## 2021-09-23 NOTE — Plan of Care (Signed)
  Problem: Clinical Measurements: Goal: Ability to maintain clinical measurements within normal limits will improve Outcome: Progressing   Problem: Activity: Goal: Risk for activity intolerance will decrease Outcome: Progressing   Problem: Pain Managment: Goal: General experience of comfort will improve Outcome: Progressing   Problem: Safety: Goal: Ability to remain free from injury will improve Outcome: Progressing   

## 2021-09-23 NOTE — Plan of Care (Signed)
  Problem: Education: Goal: Knowledge of General Education information will improve Description: Including pain rating scale, medication(s)/side effects and non-pharmacologic comfort measures Outcome: Progressing   Problem: Coping: Goal: Level of anxiety will decrease Outcome: Progressing   Problem: Safety: Goal: Ability to remain free from injury will improve Outcome: Progressing   

## 2021-09-24 DIAGNOSIS — M1612 Unilateral primary osteoarthritis, left hip: Secondary | ICD-10-CM | POA: Diagnosis not present

## 2021-09-24 LAB — BASIC METABOLIC PANEL
Anion gap: 5 (ref 5–15)
BUN: 25 mg/dL — ABNORMAL HIGH (ref 6–20)
CO2: 26 mmol/L (ref 22–32)
Calcium: 8.6 mg/dL — ABNORMAL LOW (ref 8.9–10.3)
Chloride: 106 mmol/L (ref 98–111)
Creatinine, Ser: 1.09 mg/dL (ref 0.61–1.24)
GFR, Estimated: >60 mL/min (ref >60)
Glucose, Bld: 125 mg/dL — ABNORMAL HIGH (ref 70–99)
Potassium: 4.3 mmol/L (ref 3.5–5.1)
Sodium: 137 mmol/L (ref 135–145)

## 2021-09-24 LAB — CBC
HCT: 36.3 % — ABNORMAL LOW (ref 39.0–52.0)
Hemoglobin: 12.2 g/dL — ABNORMAL LOW (ref 13.0–17.0)
MCH: 32.4 pg (ref 26.0–34.0)
MCHC: 33.6 g/dL (ref 30.0–36.0)
MCV: 96.5 fL (ref 80.0–100.0)
Platelets: 205 10*3/uL (ref 150–400)
RBC: 3.76 MIL/uL — ABNORMAL LOW (ref 4.22–5.81)
RDW: 12.3 % (ref 11.5–15.5)
WBC: 15.9 10*3/uL — ABNORMAL HIGH (ref 4.0–10.5)
nRBC: 0 % (ref 0.0–0.2)

## 2021-09-24 MED ORDER — ASPIRIN 81 MG PO CHEW
81.0000 mg | CHEWABLE_TABLET | Freq: Two times a day (BID) | ORAL | 0 refills | Status: DC
Start: 2021-09-24 — End: 2021-11-01

## 2021-09-24 MED ORDER — METHOCARBAMOL 500 MG PO TABS: 500.0000 mg | ORAL_TABLET | Freq: Four times a day (QID) | ORAL | 1 refills | Status: DC | PRN

## 2021-09-24 MED ORDER — OXYCODONE HCL 5 MG PO TABS: 5.0000 mg | ORAL_TABLET | Freq: Four times a day (QID) | ORAL | 0 refills | Status: DC | PRN

## 2021-09-24 NOTE — Progress Notes (Signed)
Provided discharge education/instructions, all questions and concerns addressed. Pt not ina cute distress, discharged home with belongings accompanied by wife. 

## 2021-09-24 NOTE — Plan of Care (Signed)

## 2021-09-24 NOTE — Evaluation (Signed)
Physical Therapy Evaluation ?Patient Details ?Name: Timothy Newman ?MRN: [id number] ?DOB: Feb 16, 1971 ?Today's Date: 09/24/2021 ? ?History of Present Illness ? Pt is a 51yo male presenting s/p L-THA, anterior approach on 09/23/21. PMH: OA.  ?Clinical Impression ? Pt motivated and progressing well with mobility.  Pt performed therex program with assist, ambulated increased distance in hall, negotiated stairs and reviewed car transfers.  Pt with no c/o dizziness; BP supine 106/62, sitting 108/75, standing 109/65 and after ambulating 112/97.  Spouse present for full session and both feeling comfortable with return home this date. ?   ? ?Recommendations for follow up therapy are one component of a multi-disciplinary discharge planning process, led by the attending physician.  Recommendations may be updated based on patient status, additional functional criteria and insurance authorization. ? ?Follow Up Recommendations Follow physician's recommendations for discharge plan and follow up therapies ? ?  ?Assistance Recommended at Discharge Set up Supervision/Assistance  ?Patient can return home with the following ? A little help with walking and/or transfers;A little help with bathing/dressing/bathroom;Assist for transportation;Help with stairs or ramp for entrance;Assistance with cooking/housework ? ?  ?Equipment Recommendations None recommended by PT  ?Recommendations for Other Services ?    ?  ?Functional Status Assessment    ? ?  ?Precautions / Restrictions Precautions ?Precautions: Fall ?Restrictions ?Weight Bearing Restrictions: No ?LLE Weight Bearing: Weight bearing as tolerated  ? ?  ? ?Mobility ? Bed Mobility ?Overal bed mobility: Needs Assistance ?Bed Mobility: Supine to Sit ?  ?  ?Supine to sit: Supervision ?  ?  ?General bed mobility comments: Increased time but no physical assist ?  ? ?Transfers ?Overall transfer level: Needs assistance ?Equipment used: Rolling walker (2 wheels) ?Transfers: Sit to/from Stand ?Sit to  Stand: Min guard ?  ?  ?  ?  ?  ?General transfer comment: Pt min guard for safety only, no physical assist required. Multimodal cuing for hand placement and sequencing. ?  ? ?Ambulation/Gait ?Ambulation/Gait assistance: Min guard, Supervision ?Gait Distance (Feet): 150 Feet ?Assistive device: Rolling walker (2 wheels) ?Gait Pattern/deviations: Decreased step length - left, Decreased stance time - left, Decreased weight shift to left, Step-to pattern, Step-through pattern, Shuffle, Trunk flexed ?  ?  ?  ?General Gait Details: cues for posture, position from RW and initial sequence ? ?Stairs ?Stairs: Yes ?Stairs assistance: Min guard ?Stair Management: One rail Right, Step to pattern, Forwards, With cane ?Number of Stairs: 3 ?General stair comments: min cues for sequence ? ?Wheelchair Mobility ?  ? ?Modified Rankin (Stroke Patients Only) ?  ? ?  ? ?Balance Overall balance assessment: Needs assistance ?Sitting-balance support: No upper extremity supported, Feet supported ?Sitting balance-Leahy Scale: Good ?  ?  ?Standing balance support: No upper extremity supported ?Standing balance-Leahy Scale: Fair ?  ?  ?  ?  ?  ?  ?  ?  ?  ?  ?  ?  ?   ? ? ? ?Pertinent Vitals/Pain Pain Assessment ?Pain Assessment: 0-10 ?Pain Score: 4  ?Pain Location: left hip ?Pain Descriptors / Indicators: Operative site guarding, Discomfort ?Pain Intervention(s): Limited activity within patient's tolerance, Monitored during session, Premedicated before session, Ice applied  ? ? ?Home Living   ?  ?  ?  ?  ?  ?  ?  ?  ?  ?   ?  ?Prior Function   ?  ?  ?  ?  ?  ?  ?  ?  ?  ? ? ?Hand Dominance  ?   ? ?  ?  Extremity/Trunk Assessment  ?   ?  ? ?  ?  ? ?   ?Communication  ?    ?Cognition Arousal/Alertness: Awake/alert ?Behavior During Therapy: [facility] for tasks assessed/performed ?Overall Cognitive Status: Within Functional Limits for tasks assessed ?  ?  ?  ?  ?  ?  ?  ?  ?  ?  ?  ?  ?  ?  ?  ?  ?  ?  ?  ? ?  ?General Comments   ? ?  ?Exercises Total  Joint Exercises ?Ankle Circles/Pumps: AROM, 20 reps, Both ?Quad Sets: AROM, Left, 10 reps, Supine ?Heel Slides: AAROM, Left, 20 reps, Supine ?Hip ABduction/ADduction: AAROM, Left, 15 reps, Supine  ? ?Assessment/Plan  ?  ?PT Assessment    ?PT Problem List   ? ?   ?  ?PT Treatment Interventions     ? ?PT Goals (Current goals can be found in the Care Plan section)  ?Acute Rehab PT Goals ?Patient Stated Goal: Put on socks myself ?PT Goal Formulation: With patient ?Time For Goal Achievement: 09/30/21 ?Potential to Achieve Goals: Good ? ?  ?Frequency 7X/week ?  ? ? ?Co-evaluation   ?  ?  ?  ?  ? ? ?  ?AM-PAC PT "6 Clicks" Mobility  ?Outcome Measure Help needed turning from your back to your side while in a flat bed without using bedrails?: A Little ?Help needed moving from lying on your back to sitting on the side of a flat bed without using bedrails?: A Little ?Help needed moving to and from a bed to a chair (including a wheelchair)?: A Little ?Help needed standing up from a chair using your arms (e.g., wheelchair or bedside chair)?: A Little ?Help needed to walk in hospital room?: A Little ?Help needed climbing 3-5 steps with a railing? : A Little ?6 Click Score: 18 ? ?  ?End of Session Equipment Utilized During Treatment: Gait belt ?Activity Tolerance: Patient tolerated treatment well ?Patient left: in chair;with call bell/phone within reach;with chair alarm set;with family/visitor present ?Nurse Communication: Mobility status ?PT Visit Diagnosis: Pain;Difficulty in walking, not elsewhere classified (R26.2) ?Pain - Right/Left: Left ?Pain - part of body: Hip ?  ? ?Time: 7048-8891 ?PT Time Calculation (min) (ACUTE ONLY): 42 min ? ? ?Charges:     ?PT Treatments ?$Gait Training: 8-22 mins ?$Therapeutic Exercise: 8-22 mins ?$Therapeutic Activity: 8-22 mins ?  ?   ? ? ?Debe Coder PT ?Acute Rehabilitation Services ?Pager [phone] ?Office [phone] ? ? ?Philisha Weinel ?09/24/2021, 10:12 AM ? ?

## 2021-09-24 NOTE — Progress Notes (Signed)
Subjective: ?1 Day Post-Op Procedure(s) (LRB): ?Left TOTAL HIP ARTHROPLASTY ANTERIOR APPROACH (Left) ?Patient reports pain as moderate.   ? ?Objective: ?Vital signs in last 24 hours: ?Temp:  [97.8 ?F (36.6 ?C)-99.1 ?F (37.3 ?C)] 99.1 ?F (37.3 ?C) (04/01 0800) ?Pulse Rate:  [58-86] 81 (04/01 0800) ?Resp:  [14-16] 16 (04/01 0800) ?BP: (91-119)/(56-74) 91/74 (04/01 0800) ?SpO2:  [96 %-100 %] 97 % (04/01 0800) ?Weight:  [114.5 kg] 114.5 kg (03/31 1232) ? ?Intake/Output from previous day: ?03/31 0701 - 04/01 0700 ?In: 4473.7 [P.O.:1325; I.V.:2748.7; IV Piggyback:400] ?Out: 2035 [Urine:1505; Blood:250] ?Intake/Output this shift: ?No intake/output data recorded. ? ?Recent Labs  ?  09/24/21 ?0310  ?HGB 12.2*  ? ?Recent Labs  ?  09/24/21 ?0310  ?WBC 15.9*  ?RBC 3.76*  ?HCT 36.3*  ?PLT 205  ? ?Recent Labs  ?  09/24/21 ?0310  ?NA 137  ?K 4.3  ?CL 106  ?CO2 26  ?BUN 25*  ?CREATININE 1.09  ?GLUCOSE 125*  ?CALCIUM 8.6*  ? ?No results for input(s): LABPT, INR in the last 72 hours. ? ?Sensation intact distally ?Intact pulses distally ?Dorsiflexion/Plantar flexion intact ?Incision: scant drainage ? ? ?Assessment/Plan: ?1 Day Post-Op Procedure(s) (LRB): ?Left TOTAL HIP ARTHROPLASTY ANTERIOR APPROACH (Left) ?Up with therapy ?Discharge home with home health ? ? ? ? ? ?Mcarthur Rossetti ?09/24/2021, 10:36 AM ? ?

## 2021-09-24 NOTE — Discharge Instructions (Signed)

## 2021-09-24 NOTE — Discharge Summary (Signed)
?Patient ID: ?Timothy Newman ?MRN: [id number] ?DOB/AGE: [birth year] 52 y.o. ? ?Admit date: 09/23/2021 ?Discharge date: 09/24/2021 ? ?Admission Diagnoses:  ?Principal Problem: ?  Primary osteoarthritis of left hip ?Active Problems: ?  Status post left hip replacement ? ? ?Discharge Diagnoses:  ?Same ? ?Past Medical History:  ?Diagnosis Date  ? Arthritis   ? Family history of abdominal aortic aneurysm (AAA) 05/16/2019  ? History of kidney stones   ? Inflamed sebaceous cyst s/p excision Nov 2014 05/12/2013  ? MRSA cellulitis ?2004  ? I&D in ED.  IV Vanco w PICC  ? Umbilical hernia - 31m 129/51/8841 ? ? ?Surgeries: Procedure(s): ?Left TOTAL HIP ARTHROPLASTY ANTERIOR APPROACH on 09/23/2021 ?  ?Consultants:  ? ?Discharged Condition: Improved ? ?Hospital Course: Timothy AUTHEMENTis an 51y.o. male who was admitted 09/23/2021 for operative treatment ofPrimary osteoarthritis of left hip. Patient has severe unremitting pain that affects sleep, daily activities, and work/hobbies. After pre-op clearance the patient was taken to the operating room on 09/23/2021 and underwent  Procedure(s): ?Left TOTAL HIP ARTHROPLASTY ANTERIOR APPROACH.   ? ?Patient was given perioperative antibiotics:  ?Anti-infectives (From admission, onward)  ? ? Start     Dose/Rate Route Frequency Ordered Stop  ? 09/23/21 1400  ceFAZolin (ANCEF) IVPB 1 g/50 mL premix       ? 1 g ?100 mL/hr over 30 Minutes Intravenous Every 6 hours 09/23/21 1036 09/23/21 2101  ? 09/23/21 0600  ceFAZolin (ANCEF) IVPB 2g/100 mL premix       ? 2 g ?200 mL/hr over 30 Minutes Intravenous On call to O.R. 09/23/21 0518 09/23/21 0721  ? ?  ?  ? ?Patient was given sequential compression devices, early ambulation, and chemoprophylaxis to prevent DVT. ? ?Patient benefited maximally from hospital stay and there were no complications.   ? ?Recent vital signs: Patient Vitals for the past 24 hrs: ? BP Temp Temp src Pulse Resp SpO2 Height Weight  ?09/24/21 0800 91/74 99.1 ?F (37.3 ?C) Oral 81 16 97 % --  --  ?09/24/21 0459 (!) 101/56 98.1 ?F (36.7 ?C) Oral 71 16 98 % -- --  ?09/24/21 0136 (!) 119/56 98.2 ?F (36.8 ?C) Oral 81 16 100 % -- --  ?09/23/21 2036 101/64 98.5 ?F (36.9 ?C) Oral 86 16 98 % -- --  ?09/23/21 1638 (!) 103/59 97.8 ?F (36.6 ?C) Oral 73 16 96 % -- --  ?09/23/21 1232 98/62 97.8 ?F (36.6 ?C) Oral 76 16 96 % '6\' 1"'$  (1.854 m) 114.5 kg  ?09/23/21 1038 101/65 98 ?F (36.7 ?C) Oral (!) 58 14 -- -- --  ?  ? ?Recent laboratory studies:  ?Recent Labs  ?  09/24/21 ?0310  ?WBC 15.9*  ?HGB 12.2*  ?HCT 36.3*  ?PLT 205  ?NA 137  ?K 4.3  ?CL 106  ?CO2 26  ?BUN 25*  ?CREATININE 1.09  ?GLUCOSE 125*  ?CALCIUM 8.6*  ? ? ? ?Discharge Medications:   ?Allergies as of 09/24/2021   ?No Known Allergies ?  ? ?  ?Medication List  ?  ? ?TAKE these medications   ? ?Airborne Chew ?Chew 3 tablets by mouth at bedtime. ?  ?aspirin 81 MG chewable tablet ?Chew 1 tablet (81 mg total) by mouth 2 (two) times daily. ?  ?methocarbamol 500 MG tablet ?Commonly known as: ROBAXIN ?Take 1 tablet (500 mg total) by mouth every 6 (six) hours as needed for muscle spasms. ?  ?oxyCODONE 5 MG immediate release tablet ?Commonly known as: Oxy IR/ROXICODONE ?  Take 1-2 tablets (5-10 mg total) by mouth every 6 (six) hours as needed for moderate pain (pain score 4-6). ?  ?VITAMIN B-12 PO ?Take 1 tablet by mouth at bedtime. gummy ?  ?Vitamin D 125 MCG (5000 UT) Caps ?Take 10,000 Units by mouth at bedtime. ?  ? ?  ? ?  ?  ? ? ?  ?Durable Medical Equipment  ?(From admission, onward)  ?  ? ? ?  ? ?  Start     Ordered  ? 09/23/21 1037  DME 3 n 1  Once       ? 09/23/21 1036  ? 09/23/21 1037  DME Walker rolling  Once       ?Question Answer Comment  ?Walker: With 5 Inch Wheels   ?Patient needs a walker to treat with the following condition Status post left hip replacement   ?  ? 09/23/21 1036  ? ?  ?  ? ?  ? ? ?Diagnostic Studies: DG Pelvis Portable ? ?Result Date: 09/23/2021 ?CLINICAL DATA:  Postop left hip replacement. EXAM: PORTABLE PELVIS 1-2 VIEWS COMPARISON:   Intraoperative fluoroscopic images of the left hip 09/23/2021. Radiographs of the left hip 03/14/2021. FINDINGS: Immediate postoperative changes from left total hip replacement. The femoral and acetabular components appear well-seated. Edema and gas within the surrounding soft tissues. Overlying skin staples. IMPRESSION: Immediate postoperative changes from left total hip arthroplasty, as described. Electronically Signed   By: Kellie Simmering D.O.   On: 09/23/2021 08:43  ? ?DG C-Arm 1-60 Min-No Report ? ?Result Date: 09/23/2021 ?Fluoroscopy was utilized by the requesting physician.  No radiographic interpretation.  ? ?DG HIP UNILAT WITH PELVIS 1V LEFT ? ?Result Date: 09/23/2021 ?CLINICAL DATA:  Left hip replacement EXAM: DG HIP (WITH OR WITHOUT PELVIS) 1V*L* COMPARISON:  None. FINDINGS: Fluoroscopic images were obtained intraoperatively and submitted for post operative interpretation. Left total hip arthroplasty with hardware in expected position, 2 images were obtained with 11 seconds of fluoroscopy time and 2.6 mGy. Please see the performing provider's procedural report for further detail. IMPRESSION: As above. Electronically Signed   By: Yetta Glassman M.D.   On: 09/23/2021 08:19   ? ?Disposition: Discharge disposition: 01-Home or Self Care ? ? ? ? ? ? ?Discharge Instructions   ? ? Discharge patient   Complete by: As directed ?  ? Discharge disposition: 01-Home or Self Care  ? Discharge patient date: 09/24/2021  ? ?  ? ? ? Follow-up Information   ? ? [facility]. Follow up.   ?Specialty: Home Health Services ?Why: providing home health physican therapy ?Contact information: ?[address] ?[place] [address] ?[phone] ? ? ?  ?  ? ? Mcarthur Rossetti, MD Follow up in 2 week(s).   ?Specialty: Orthopedic Surgery ?Contact information: ?[address] ?[place] [address] ?[phone] ? ? ?  ?  ? ?  ?  ? ?  ? ? ? ?Signed: ?Mcarthur Rossetti ?09/24/2021, 10:37 AM ? ? ? ?

## 2021-09-26 ENCOUNTER — Encounter (HOSPITAL_COMMUNITY): Payer: Self-pay | Admitting: Orthopaedic Surgery

## 2021-10-06 ENCOUNTER — Ambulatory Visit (INDEPENDENT_AMBULATORY_CARE_PROVIDER_SITE_OTHER): Payer: 59 | Admitting: Orthopaedic Surgery

## 2021-10-06 DIAGNOSIS — Z96642 Presence of left artificial hip joint: Secondary | ICD-10-CM

## 2021-10-06 NOTE — Progress Notes (Signed)
Patient is a 51 year old gentleman who is 2 weeks out from a left total hip arthroplasty.  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  He is ambulate with a cane.  He has been compliant with wearing TED hose and taking a baby aspirin twice a day.  He denies any calf pain and denies any foot and ankle swelling.  He is doing well overall.  He is not taking any pain medicines either. ? ?His left hip incision looks good.  The staples are removed and Steri-Strips applied.  His calf is soft and there is no significant seroma. ? ?He did let me know he feels like there may be a slight leg length discrepancy.  I told him at his next visit to bring that up because then I would have him lay in a supine position and truly see if that is the case we would want any type of insert.  He does not have any significant lurch and his gait.  All question concerns were answered and addressed. ?

## 2021-10-28 ENCOUNTER — Encounter: Payer: Self-pay | Admitting: Adult Health

## 2021-10-28 NOTE — Progress Notes (Signed)
Complete Physical ? ?Assessment and Plan: ? ?Breylon was seen today for annual exam. ? ?Diagnoses and all orders for this visit: ? ?Encounter for Annual Physical Exam with abnormal findings ?Due annually  ?Health Maintenance reviewed ?Healthy lifestyle reviewed and goals set ? ?Advised colonoscopy due, referral placed  ? ?Obesity (BMI 30.0-34.9) ?Long discussion about weight loss, diet, and exercise ?Recommended diet heavy in fruits and veggies and low in animal meats, cheeses, and dairy products, appropriate calorie intake ?Discussed appropriate weight for height  and initial goal (215lb)  ?Follow up at next visit ? ?Medication management ?-     CBC with Differential/Platelet ?-     COMPLETE METABOLIC PANEL WITH GFR ?-     Urinalysis w microscopic + reflex cultur ?-     Magnesium ? ?Hyperlipidemia ?Currently managed by lifestyle ?LDL goal 100 ?He admits to continued red meat intake/ not watching saturated fats ?Discussed risk of cardiovascular disease ?Plan 6 months of focused dietary changes/recheck and plan to start chol med if LDL not significantly improved ?Continue low cholesterol diet and exercise.  ?Reduced saturated/trans fats, reduced animal fats, increase fiber ?Check lipid panel.  ?-     Lipid panel ?-     TSH ? ?Screening for diabetes mellitus ?-     Defer A1C; high deductible plan; had normal in 2019, no family history, working on lifestyle/weight reduction, check CMP for serum glucose, recently have been normal  ? ?Vitamin D deficiency ?Continue supplement for goal 60-100 ?-     VITAMIN D 25 Hydroxy (Vit-D Deficiency, Fractures) ? ?Kidney stones ?Several remaining per CT in ED reviewed with patient ?Push water intake ?[facility] follows ? ?Umbilical hernia ?Mild, stable, non-tender;  ?reviewed s/s to monitor with ED precautions ? ?Family hx of AAA ?- [place] screening done in office 10/28/2020, negative ?- non smoker, BP well controlled -  ?  ?Family history malignant melanoma ?No highly concerning  lesions noted today; patient is requesting derm referral to establish care and for full body skin check. Referral placed.  ? ?S/p L hip arthroplasty  ?Dr. Ninfa Linden, has upcoming follow up, no concerns ? ?Enlarged prostate/prostate cancer screening ?- denies sx/LUTS, after discussion with start annual PSA screening ?- PSA ? ?Colon cancer screening ?- GI referral placed for colonoscopy ?- reviewed reduce red meat, processed meats, increase fiber  ? ?Orders Placed This Encounter  ?Procedures  ? CBC with Differential/Platelet  ? COMPLETE METABOLIC PANEL WITH GFR  ? Magnesium  ? Lipid panel  ? TSH  ? VITAMIN D 25 Hydroxy (Vit-D Deficiency, Fractures)  ? PSA  ? Microalbumin / creatinine urine ratio  ? Urinalysis, Routine w reflex microscopic  ? Ambulatory referral to Gastroenterology  ? Ambulatory referral to Dermatology  ? EKG 12-Lead  ? ? ?Discussed med's effects and SE's. Screening labs and tests as requested with regular follow-up as recommended. ?Over 40 minutes of exam, counseling, chart review and critical decision making was performed ? ?Future Appointments  ?Date Time Provider [place]  ?11/03/2021  1:45 PM Mcarthur Rossetti, MD [facility] None  ?11/02/2022 10:00 AM Darrol Jump, NP [facility] None  ? ? ? ?HPI ?51 y.o. male patient presents for a complete physical. He has Umbilical hernia - 52m; Obesity (BMI 30.0-34.9); Kidney stones; Hyperlipidemia; Enlarged prostate; Renal cyst; and Status post left hip replacement on their problem list.  ? ?He is married to [employer] Groupwho also comes here. 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    ? ?He recently 09/23/2021 underwent L anterior hip arthroplasty by Dr. Jean Rosenthal, has final follow up this Thursday. Denies concerns, no pain, finished PT. ? ?Hx of kidney stones; he presented to ED 06/11/2021 with flank pain and gross hematuria and was found to have non-obstructing stone which he passed  spontaneously without complication. CT at that time also showed numerous other small stones bilaterally and L parapelvic cyst within the lower pole the left kidney and enlarged prostate, no mention of complex cyst. Established with [facility] for PRN follow up.  ? ?BMI is Body mass index is 34.58 kg/m?., he has been working on diet and exercise, down from 269lb.  ?Quit sodas.  ?He drinks 2-3 cups of coffee daily but admits too much creamer ?Drinks ~1 gallon water daily.  ?Sleeps 7 hours most days.  ?Physically active job ?He does intermittent fasting sometimes, skips breakfast.  ?Typically has protein + veggies for dinner, lunch varies (typical would be white wheat sandwich with lunch meat, ham or roast beef).  ?Eats 2-3 servings of red meat weekly.  ?Wt Readings from Last 3 Encounters:  ?11/01/21 255 lb (115.7 kg)  ?09/23/21 252 lb 6.4 oz (114.5 kg)  ?09/12/21 252 lb 6.4 oz (114.5 kg)  ? ?His blood pressure has been controlled at home, today their BP is BP: 110/72 ?He does workout. He denies chest pain, shortness of breath, dizziness.  ? ? He is not on cholesterol medication and denies myalgias. His cholesterol is not at goal of LDL <100. The cholesterol last visit was:   ?Lab Results  ?Component Value Date  ? CHOL 200 (H) 10/28/2020  ? HDL 44 10/28/2020  ? LDLCALC 127 (H) 10/28/2020  ? TRIG 176 (H) 10/28/2020  ? CHOLHDL 4.5 10/28/2020  ? ? He has been working on diet and exercise for glucose management, and denies increased appetite, nausea, paresthesia of the feet, polydipsia, polyuria and visual disturbances. Last A1C in the office was:  ?Lab Results  ?Component Value Date  ? HGBA1C 5.1 05/14/2018  ? ? Last GFR:  ?Lab Results  ?Component Value Date  ? EGFR 82 06/16/2021  ? ?Patient is on Vitamin D supplement, taking 5000 IU daily.   ?Lab Results  ?Component Value Date  ? VD25OH 96 10/28/2020  ?   ?He has enlarged prostate per CT abd 06/11/2021.  ?Denies LUTs, no family hx of prostate cancer.  ?No results  found for: PSA1, PSA ? ? ?Current Medications:  ?Current Outpatient Medications on File Prior to Visit  ?Medication Sig Dispense Refill  ? Cholecalciferol (VITAMIN D) 125 MCG (5000 UT) CAPS Take 10,000 Units by mouth at bedtime.    ? Cyanocobalamin (VITAMIN B-12 PO) Take 1 tablet by mouth at bedtime. gummy    ? Multiple Vitamins-Minerals (AIRBORNE) CHEW Chew 3 tablets by mouth at bedtime.    ? ?No current facility-administered medications on file prior to visit.  ? ?Allergies:  ?Not on File ?Health Maintenance:  ? ?There is no immunization history on file for this patient. ? ?Health Maintenance  ?Topic Date Due  ? COLONOSCOPY (Pts 45-68yr Insurance coverage will need to be confirmed)  Never done  ? COVID-19 Vaccine (1) 11/17/2021 (Originally 07/23/1971)  ? Zoster Vaccines- Shingrix (1 of 2) 02/01/2022 (Originally [birth year]/20/1989)  ? INFLUENZA VACCINE  01/24/2022  ? TETANUS/TDAP  06/26/2024  ? HIV Screening  Completed  ? HPV VACCINES  Aged Out  ? Hepatitis C Screening  Discontinued  ? ?Flu  vaccine: declines ?Shingrix: declines ?Covid 19: declines  ? ?Colonoscopy: will refer to GI,  ?EGD: - ? ?Eye Exam: Dr. Truman Hayward at [facility] on [address], last visit 2022, new glasses ?Dentist: Dr. Cloretta Ned, last visit 2023, goes q52m ?Derm: Referred to derm last year but never heard back, family history of melanoma, requests another referral  ? ?Patient Care Team: ?MUnk Pinto MD as PCP - General (Internal Medicine) ? ?Medical History:  ?has Umbilical hernia - 58m Obesity (BMI 30.0-34.9); Kidney stones; Hyperlipidemia; Enlarged prostate; Renal cyst; and Status post left hip replacement on their problem list. ?Surgical History:  ?He  has a past surgical history that includes Incision and drainage abscess / hematoma of bursa / knee / thigh (Right, ?2004); Wisdom tooth extraction (1985); and Total hip arthroplasty (Left, 09/23/2021). ?Family History:  ?His family history includes AAA (abdominal aortic aneurysm) in his maternal  grandmother; Asthma in his mother; Cancer in his paternal grandmother; Fibromyalgia in his mother; Heart failure (age of onset: 9043in his paternal grandfather; Hyperlipidemia in his mother; Lung cancer in his mater

## 2021-10-31 ENCOUNTER — Encounter: Payer: 59 | Admitting: Nurse Practitioner

## 2021-11-01 ENCOUNTER — Ambulatory Visit (INDEPENDENT_AMBULATORY_CARE_PROVIDER_SITE_OTHER): Payer: 59 | Admitting: Adult Health

## 2021-11-01 ENCOUNTER — Encounter: Payer: Self-pay | Admitting: Adult Health

## 2021-11-01 VITALS — BP 110/72 | HR 74 | Temp 97.5°F | Ht 72.0 in | Wt 255.0 lb

## 2021-11-01 DIAGNOSIS — Z0000 Encounter for general adult medical examination without abnormal findings: Secondary | ICD-10-CM | POA: Diagnosis not present

## 2021-11-01 DIAGNOSIS — N281 Cyst of kidney, acquired: Secondary | ICD-10-CM

## 2021-11-01 DIAGNOSIS — Z96642 Presence of left artificial hip joint: Secondary | ICD-10-CM

## 2021-11-01 DIAGNOSIS — E669 Obesity, unspecified: Secondary | ICD-10-CM

## 2021-11-01 DIAGNOSIS — Z1329 Encounter for screening for other suspected endocrine disorder: Secondary | ICD-10-CM

## 2021-11-01 DIAGNOSIS — Z136 Encounter for screening for cardiovascular disorders: Secondary | ICD-10-CM

## 2021-11-01 DIAGNOSIS — E785 Hyperlipidemia, unspecified: Secondary | ICD-10-CM

## 2021-11-01 DIAGNOSIS — Z1389 Encounter for screening for other disorder: Secondary | ICD-10-CM

## 2021-11-01 DIAGNOSIS — N400 Enlarged prostate without lower urinary tract symptoms: Secondary | ICD-10-CM

## 2021-11-01 DIAGNOSIS — Z125 Encounter for screening for malignant neoplasm of prostate: Secondary | ICD-10-CM

## 2021-11-01 DIAGNOSIS — Z808 Family history of malignant neoplasm of other organs or systems: Secondary | ICD-10-CM

## 2021-11-01 DIAGNOSIS — Z131 Encounter for screening for diabetes mellitus: Secondary | ICD-10-CM

## 2021-11-01 DIAGNOSIS — K429 Umbilical hernia without obstruction or gangrene: Secondary | ICD-10-CM

## 2021-11-01 DIAGNOSIS — E559 Vitamin D deficiency, unspecified: Secondary | ICD-10-CM

## 2021-11-01 DIAGNOSIS — Z0001 Encounter for general adult medical examination with abnormal findings: Secondary | ICD-10-CM

## 2021-11-01 DIAGNOSIS — R030 Elevated blood-pressure reading, without diagnosis of hypertension: Secondary | ICD-10-CM | POA: Diagnosis not present

## 2021-11-01 DIAGNOSIS — Z1211 Encounter for screening for malignant neoplasm of colon: Secondary | ICD-10-CM

## 2021-11-01 NOTE — Patient Instructions (Addendum)
?[name] , ?Thank you for taking time to come for your Annual Wellness Visit. I appreciate your ongoing commitment to your health goals. Please review the following plan we discussed and let me know if I can assist you in the future.  ? ?These are the goals we discussed: ? Goals   ? ?  DIET - EAT MORE FRUITS AND VEGETABLES   ?  7+ servings (1/2 cup) daily, limit red meat intake to 6 oz/week ? ?  ?  LDL CALC < 100   ?  Weight (lb) < 215 lb (97.5 kg)   ? ?  ?  ?This is a list of the screening recommended for you and due dates:  ?Health Maintenance  ?Topic Date Due  ? Colon Cancer Screening  Never done  ? COVID-19 Vaccine (1) 11/17/2021*  ? Zoster (Shingles) Vaccine (1 of 2) 02/01/2022*  ? Flu Shot  01/24/2022  ? Tetanus Vaccine  06/26/2024  ? HIV Screening  Completed  ? HPV Vaccine  Aged Out  ? Hepatitis C Screening: USPSTF Recommendation to screen - Ages 20-79 yo.  Discontinued  ?*Topic was postponed. The date shown is not the original due date.  ? ? ? ? ?Preventing High Cholesterol ?Cholesterol is a white, waxy substance similar to fat that the human body needs to help build cells. The liver makes all the cholesterol that a person's body needs. Having high cholesterol (hypercholesterolemia) increases your risk for heart disease and stroke. Extra or excess cholesterol comes from the food that you eat. ?High cholesterol can often be prevented with diet and lifestyle changes. If you already have high cholesterol, you can control it with diet, lifestyle changes, and medicines. ?How can high cholesterol affect me? ?If you have high cholesterol, fatty deposits (plaques) may build up on the walls of your blood vessels. The blood vessels that carry blood away from your heart are called arteries. Plaques make the arteries narrower and stiffer. This in turn can: ?Restrict or block blood flow and cause blood clots to form. ?Increase your risk for heart attack and stroke. ?What can increase my risk for high  cholesterol? ?This condition is more likely to develop in people who: ?Eat foods that are high in saturated fat or cholesterol. Saturated fat is mostly found in foods that come from animal sources. ?Are overweight. ?Are not getting enough exercise. ?Use products that contain nicotine or tobacco, such as cigarettes, e-cigarettes, and chewing tobacco. ?Have a family history of high cholesterol (familial hypercholesterolemia). ?What actions can I take to prevent this? ?Nutrition ? ?Eat less saturated fat. ?Avoid trans fats (partially hydrogenated oils). These are often found in margarine and in some baked goods, fried foods, and snacks bought in packages. ?Avoid precooked or cured meat, such as bacon, sausages, or meat loaves. ?Avoid foods and drinks that have added sugars. ?Eat more fruits, vegetables, and whole grains. ?Choose healthy sources of protein, such as fish, poultry, lean cuts of red meat, beans, peas, lentils, and nuts. ?Choose healthy sources of fat, such as: ?Nuts. ?Vegetable oils, especially olive oil. ?Fish that have healthy fats, such as omega-3 fatty acids. These fish include mackerel or salmon. ?Lifestyle ?Lose weight if you are overweight. Maintaining a healthy body mass index (BMI) can help prevent or control high cholesterol. It can also lower your risk for diabetes and high blood pressure. Ask your health care provider to help you with a diet and exercise plan to lose weight safely. ?Do not use any products that contain  nicotine or tobacco. These products include cigarettes, chewing tobacco, and vaping devices, such as e-cigarettes. If you need help quitting, ask your health care provider. ?Alcohol use ?Do not drink alcohol if: ?Your health care provider tells you not to drink. ?You are pregnant, may be pregnant, or are planning to become pregnant. ?If you drink alcohol: ?Limit how much you have to: ?0-1 drink a day for women. ?0-2 drinks a day for men. ?Know how much alcohol is in your drink.  In the [place] one drink equals one 12 oz bottle of beer (355 mL), one 5 oz glass of wine (148 mL), or one 1? oz glass of hard liquor (44 mL). ?Activity ? ?Get enough exercise. Do exercises as told by your health care provider. ?Each week, do at least 150 minutes of exercise that takes a medium level of effort (moderate-intensity exercise). This kind of exercise: ?Makes your heart beat faster while allowing you to still be able to talk. ?Can be done in short sessions several times a day or longer sessions a few times a week. For example, on 5 days each week, you could walk fast or ride your bike 3 times a day for 10 minutes each time. ?Medicines ?Your health care provider may recommend medicines to help lower cholesterol. This may be a medicine to lower the amount of cholesterol that your liver makes. You may need medicine if: ?Diet and lifestyle changes have not lowered your cholesterol enough. ?You have high cholesterol and other risk factors for heart disease or stroke. ?Take over-the-counter and prescription medicines only as told by your health care provider. ?General information ?Manage your risk factors for high cholesterol. Talk with your health care provider about all your risk factors and how to lower your risk. ?Manage other conditions that you have, such as diabetes or high blood pressure (hypertension). ?Have blood tests to check your cholesterol levels at regular points in time as told by your health care provider. ?Keep all follow-up visits. This is important. ?Where to find more information ?American Heart Association: www.heart.org ?National Heart, Lung, and Blood Institute: https://wilson-eaton.com/ ?Summary ?High cholesterol increases your risk for heart disease and stroke. By keeping your cholesterol level low, you can reduce your risk for these conditions. ?High cholesterol can often be prevented with diet and lifestyle changes. ?Work with your health care provider to manage your risk factors, and have  your blood tested regularly. ?This information is not intended to replace advice given to you by your health care provider. Make sure you discuss any questions you have with your health care provider. ?Document Revised: 08/16/2020 Document Reviewed: 08/16/2020 ?Elsevier Patient Education ? [place]. ? ? ?

## 2021-11-02 LAB — LIPID PANEL
Cholesterol: 217 mg/dL — ABNORMAL HIGH (ref <200)
HDL: 55 mg/dL (ref >=40)
LDL Cholesterol (Calc): 140 mg/dL (calc) — ABNORMAL HIGH
Non-HDL Cholesterol (Calc): 162 mg/dL (calc) — ABNORMAL HIGH (ref <130)
Total CHOL/HDL Ratio: 3.9 (calc) (ref <5.0)
Triglycerides: 106 mg/dL (ref <150)

## 2021-11-02 LAB — VITAMIN D 25 HYDROXY (VIT D DEFICIENCY, FRACTURES): Vit D, 25-Hydroxy: 115 ng/mL — ABNORMAL HIGH (ref 30–100)

## 2021-11-02 LAB — CBC WITH DIFFERENTIAL/PLATELET
Absolute Monocytes: 791 cells/uL (ref 200–950)
Basophils Absolute: 77 cells/uL (ref 0–200)
Basophils Relative: 1.1 %
Eosinophils Absolute: 252 cells/uL (ref 15–500)
Eosinophils Relative: 3.6 %
HCT: 42.9 % (ref 38.5–50.0)
Hemoglobin: 14.4 g/dL (ref 13.2–17.1)
Lymphs Abs: 1953 cells/uL (ref 850–3900)
MCH: 31.8 pg (ref 27.0–33.0)
MCHC: 33.6 g/dL (ref 32.0–36.0)
MCV: 94.7 fL (ref 80.0–100.0)
MPV: 10 fL (ref 7.5–12.5)
Monocytes Relative: 11.3 %
Neutro Abs: 3927 cells/uL (ref 1500–7800)
Neutrophils Relative %: 56.1 %
Platelets: 264 10*3/uL (ref 140–400)
RBC: 4.53 10*6/uL (ref 4.20–5.80)
RDW: 12.5 % (ref 11.0–15.0)
Total Lymphocyte: 27.9 %
WBC: 7 10*3/uL (ref 3.8–10.8)

## 2021-11-02 LAB — COMPLETE METABOLIC PANEL WITH GFR
AG Ratio: 1.6 (calc) (ref 1.0–2.5)
ALT: 21 U/L (ref 9–46)
AST: 15 U/L (ref 10–35)
Albumin: 4.4 g/dL (ref 3.6–5.1)
Alkaline phosphatase (APISO): 56 U/L (ref 35–144)
BUN: 18 mg/dL (ref 7–25)
CO2: 27 mmol/L (ref 20–32)
Calcium: 9.7 mg/dL (ref 8.6–10.3)
Chloride: 104 mmol/L (ref 98–110)
Creat: 1.16 mg/dL (ref 0.70–1.30)
Globulin: 2.7 g/dL (calc) (ref 1.9–3.7)
Glucose, Bld: 89 mg/dL (ref 65–99)
Potassium: 4.6 mmol/L (ref 3.5–5.3)
Sodium: 140 mmol/L (ref 135–146)
Total Bilirubin: 0.6 mg/dL (ref 0.2–1.2)
Total Protein: 7.1 g/dL (ref 6.1–8.1)
eGFR: 76 mL/min/{1.73_m2} (ref >=60)

## 2021-11-02 LAB — MICROALBUMIN / CREATININE URINE RATIO
Creatinine, Urine: 110 mg/dL (ref 20–320)
Microalb Creat Ratio: 5 mcg/mg creat (ref <30)
Microalb, Ur: 0.5 mg/dL

## 2021-11-02 LAB — URINALYSIS, ROUTINE W REFLEX MICROSCOPIC
Bilirubin Urine: NEGATIVE
Glucose, UA: NEGATIVE
Hgb urine dipstick: NEGATIVE
Ketones, ur: NEGATIVE
Leukocytes,Ua: NEGATIVE
Nitrite: NEGATIVE
Protein, ur: NEGATIVE
Specific Gravity, Urine: 1.018 (ref 1.001–1.035)
pH: 6.5 (ref 5.0–8.0)

## 2021-11-02 LAB — MAGNESIUM: Magnesium: 2.2 mg/dL (ref 1.5–2.5)

## 2021-11-02 LAB — TSH: TSH: 1.48 mIU/L (ref 0.40–4.50)

## 2021-11-02 LAB — PSA: PSA: 0.74 ng/mL (ref <=4.00)

## 2021-11-03 ENCOUNTER — Ambulatory Visit (INDEPENDENT_AMBULATORY_CARE_PROVIDER_SITE_OTHER): Payer: 59 | Admitting: Orthopaedic Surgery

## 2021-11-03 DIAGNOSIS — Z96642 Presence of left artificial hip joint: Secondary | ICD-10-CM

## 2021-11-03 NOTE — Progress Notes (Signed)
The patient is now 6 weeks tomorrow status post a left total hip arthroplasty.  He is back to his regular work.  He does report stiffness in the hip and wants to know really how far can he push things.  He has no complaints otherwise. ? ?There is still stiffness with left hip flexion as well as rotation but overall looks like it is improving.  He is walking with a normal gait as well. ? ?I will let him push himself as far as he can with stretching.  All question concerns were answered and addressed.  We will see him back in 6 months for repeat standing AP pelvis and lateral of his left operative hip.  If there is issues before then he knows to let us know. ?

## 2022-05-04 ENCOUNTER — Ambulatory Visit: Payer: 59 | Admitting: Adult Health

## 2022-05-04 ENCOUNTER — Encounter: Payer: Self-pay | Admitting: Nurse Practitioner

## 2022-05-04 ENCOUNTER — Ambulatory Visit (INDEPENDENT_AMBULATORY_CARE_PROVIDER_SITE_OTHER): Payer: 59 | Admitting: Nurse Practitioner

## 2022-05-04 VITALS — BP 138/80 | HR 74 | Temp 97.9°F | Resp 17 | Ht 72.0 in | Wt 253.2 lb

## 2022-05-04 DIAGNOSIS — Z79899 Other long term (current) drug therapy: Secondary | ICD-10-CM

## 2022-05-04 DIAGNOSIS — Z96642 Presence of left artificial hip joint: Secondary | ICD-10-CM

## 2022-05-04 DIAGNOSIS — N200 Calculus of kidney: Secondary | ICD-10-CM | POA: Diagnosis not present

## 2022-05-04 DIAGNOSIS — E785 Hyperlipidemia, unspecified: Secondary | ICD-10-CM | POA: Diagnosis not present

## 2022-05-04 LAB — CBC WITH DIFFERENTIAL/PLATELET
Absolute Monocytes: 781 cells/uL (ref 200–950)
Basophils Absolute: 62 cells/uL (ref 0–200)
Basophils Relative: 1 %
Eosinophils Absolute: 161 cells/uL (ref 15–500)
Eosinophils Relative: 2.6 %
HCT: 45.9 % (ref 38.5–50.0)
Hemoglobin: 16 g/dL (ref 13.2–17.1)
Lymphs Abs: 1947 cells/uL (ref 850–3900)
MCH: 32.7 pg (ref 27.0–33.0)
MCHC: 34.9 g/dL (ref 32.0–36.0)
MCV: 93.9 fL (ref 80.0–100.0)
MPV: 9.7 fL (ref 7.5–12.5)
Monocytes Relative: 12.6 %
Neutro Abs: 3249 cells/uL (ref 1500–7800)
Neutrophils Relative %: 52.4 %
Platelets: 263 10*3/uL (ref 140–400)
RBC: 4.89 10*6/uL (ref 4.20–5.80)
RDW: 12 % (ref 11.0–15.0)
Total Lymphocyte: 31.4 %
WBC: 6.2 10*3/uL (ref 3.8–10.8)

## 2022-05-04 LAB — LIPID PANEL
Cholesterol: 218 mg/dL — ABNORMAL HIGH (ref <200)
HDL: 53 mg/dL (ref >=40)
LDL Cholesterol (Calc): 136 mg/dL (calc) — ABNORMAL HIGH
Non-HDL Cholesterol (Calc): 165 mg/dL (calc) — ABNORMAL HIGH (ref <130)
Total CHOL/HDL Ratio: 4.1 (calc) (ref <5.0)
Triglycerides: 157 mg/dL — ABNORMAL HIGH (ref <150)

## 2022-05-04 LAB — COMPLETE METABOLIC PANEL WITH GFR
AG Ratio: 1.8 (calc) (ref 1.0–2.5)
ALT: 24 U/L (ref 9–46)
AST: 17 U/L (ref 10–35)
Albumin: 4.7 g/dL (ref 3.6–5.1)
Alkaline phosphatase (APISO): 50 U/L (ref 35–144)
BUN: 20 mg/dL (ref 7–25)
CO2: 27 mmol/L (ref 20–32)
Calcium: 9.9 mg/dL (ref 8.6–10.3)
Chloride: 102 mmol/L (ref 98–110)
Creat: 1.08 mg/dL (ref 0.70–1.30)
Globulin: 2.6 g/dL (calc) (ref 1.9–3.7)
Glucose, Bld: 87 mg/dL (ref 65–99)
Potassium: 4.8 mmol/L (ref 3.5–5.3)
Sodium: 138 mmol/L (ref 135–146)
Total Bilirubin: 0.9 mg/dL (ref 0.2–1.2)
Total Protein: 7.3 g/dL (ref 6.1–8.1)
eGFR: 83 mL/min/{1.73_m2} (ref >=60)

## 2022-05-04 NOTE — Progress Notes (Signed)
Follow Up  Assessment and Plan:  Timothy Newman was seen today for a general follow up  Diagnoses and all orders for this visit:   Hyperlipidemia Discussed lifestyle modifications. Recommended diet heavy in fruits and veggies, omega 3's. Decrease consumption of animal meats, cheeses, and dairy products. Remain active and exercise as tolerated. Continue to monitor. Check lipids/TSH   Kidney stones Several remaining per CT in ED reviewed with patient Push water intake [facility] follows Continue Flomax  S/p L hip arthroplasty  Dr. Ninfa Linden, has upcoming follow up, no concerns  Medication management All medications discussed and reviewed in full. All questions and concerns regarding medications addressed.    Orders Placed This Encounter  Procedures   CBC with Differential/Platelet   COMPLETE METABOLIC PANEL WITH GFR   Lipid panel     Discussed med's effects and SE's. Screening labs and tests as requested with regular follow-up as recommended.  Over 20 minutes of exam, counseling, chart review and critical decision making was performed  Future Appointments  Date Time Provider [place]  05/08/2022  8:15 AM Mcarthur Rossetti, MD [facility] None  11/02/2022 10:00 AM Darrol Jump, NP [facility] None     HPI 51 y.o. male patient presents for a a 6 mo follow up. He has Umbilical hernia - 42m; Obesity (BMI 30.0-34.9); Kidney stones; Hyperlipidemia; Enlarged prostate; Renal cyst; and Status post left hip replacement on their problem list.   Overall he reports feeling well.    He recently 09/23/2021 underwent L anterior hip arthroplasty by Dr. CJean Rosenthal has final follow up to be released.  Is looking forward to getting back into the gym.    Hx of kidney stones; he presented to ED 06/11/2021 with flank pain and gross hematuria and was found to have non-obstructing stone which he passed spontaneously without complication. CT at that time also showed  numerous other small stones bilaterally and L parapelvic cyst within the lower pole the left kidney and enlarged prostate, no mention of complex cyst. Established with [facility] for PRN follow up. He is having right flank today.  Trying to stay well hydrated.  Denies fever, chill, N/V.    He returns for check on cholesterol.  He has been working on diet, avoiding cheeses.  Has not been able to work out much d/t hip replacement.  He has lost 2 lb in the last 5 mo.  BMI is Body mass index is 34.34 kg/m., he has been working on diet and exercise. Wt Readings from Last 3 Encounters:  05/04/22 253 lb 3.2 oz (114.9 kg)  11/01/21 255 lb (115.7 kg)  09/23/21 252 lb 6.4 oz (114.5 kg)   His blood pressure has been controlled at home, today their BP is BP: 138/80 He does workout. He denies chest pain, shortness of breath, dizziness.    He is not on cholesterol medication. His cholesterol is not at goal of LDL <100. The cholesterol last visit was:   Lab Results  Component Value Date   CHOL 217 (H) 11/01/2021   HDL 55 11/01/2021   LDLCALC 140 (H) 11/01/2021   TRIG 106 11/01/2021   CHOLHDL 3.9 11/01/2021    He has been working on diet and exercise for glucose management, and denies increased appetite, nausea, paresthesia of the feet, polydipsia, polyuria and visual disturbances. Last A1C in the office was:  Lab Results  Component Value Date   HGBA1C 5.1 05/14/2018    Last GFR:  Lab Results  Component Value Date  EGFR 76 11/01/2021      Current Medications:  Current Outpatient Medications on File Prior to Visit  Medication Sig Dispense Refill   Cholecalciferol (VITAMIN D) 125 MCG (5000 UT) CAPS Take 10,000 Units by mouth at bedtime.     Cyanocobalamin (VITAMIN B-12 PO) Take 1 tablet by mouth at bedtime. gummy     Multiple Vitamins-Minerals (AIRBORNE) CHEW Chew 3 tablets by mouth at bedtime.     No current facility-administered medications on file prior to visit.   Allergies:   Not on File Health Maintenance:   There is no immunization history on file for this patient.  Health Maintenance  Topic Date Due   COVID-19 Vaccine (1) Never done   Zoster Vaccines- Shingrix (1 of 2) Never done   COLONOSCOPY (Pts 45-58yr Insurance coverage will need to be confirmed)  Never done   INFLUENZA VACCINE  01/24/2022   TETANUS/TDAP  06/26/2024   HIV Screening  Completed   HPV VACCINES  Aged Out   Hepatitis C Screening  Discontinued   Flu vaccine: declines Shingrix: declines Covid 19: declines   Colonoscopy: will refer to GI,  EGD: -  [facility] [address], last visit 20[facility], new glasses Dentist: Dr. TRadford Pax& [place] Shan last visit 2023, goes q646mDerm: Referred to derm last year but never heard back, family history of melanoma, requests another referral   Patient Care Team: McUnk PintoMD as PCP - General (Internal Medicine)  Medical History:  has Umbilical hernia - 50m350mObesity (BMI 30.0-34.9); Kidney stones; Hyperlipidemia; Enlarged prostate; Renal cyst; and Status post left hip replacement on their problem list. Surgical History:  He  has a past surgical history that includes Incision and drainage abscess / hematoma of bursa / knee / thigh (Right, ?2004); Wisdom tooth extraction (1985); and Total hip arthroplasty (Left, 09/23/2021). Family History:  His family history includes AAA (abdominal aortic aneurysm) in his maternal grandmother; Asthma in his mother; Cancer in his paternal grandmother; Fibromyalgia in his mother; Heart failure (age of onset: 90)81n his paternal grandfather; Hyperlipidemia in his mother; Lung cancer in his maternal grandfather; Melanoma in his father. Social History:   reports that he quit smoking about 29 years ago. His smoking use included cigarettes. He has a 3.00 pack-year smoking history. He quit smokeless tobacco use about 6 years ago.  His smokeless tobacco use included snuff. He reports that he does not drink alcohol  and does not use drugs.  Review of Systems:  Review of Systems  Constitutional:  Negative for malaise/fatigue and weight loss.  HENT:  Negative for hearing loss and tinnitus.   Eyes:  Negative for blurred vision and double vision.  Respiratory:  Negative for cough, sputum production, shortness of breath and wheezing.   Cardiovascular:  Negative for chest pain, palpitations, orthopnea, claudication, leg swelling and PND.  Gastrointestinal:  Negative for abdominal pain, blood in stool, constipation, diarrhea, heartburn, melena, nausea and vomiting.  Genitourinary: Negative.   Musculoskeletal:  Positive for joint pain (left hip, 12+ months ). Negative for falls and myalgias.  Skin:  Positive for rash (bil feet).  Neurological:  Negative for dizziness, tingling, sensory change, weakness and headaches.  Endo/Heme/Allergies:  Negative for polydipsia.  Psychiatric/Behavioral: Negative.  Negative for depression, memory loss, substance abuse and suicidal ideas. The patient is not nervous/anxious and does not have insomnia.   All other systems reviewed and are negative.   Physical Exam: Estimated body mass index is 34.34 kg/m as  calculated from the following:   Height as of this encounter: 6' (1.829 m).   Weight as of this encounter: 253 lb 3.2 oz (114.9 kg). BP 138/80   Pulse 74   Temp 97.9 F (36.6 C)   Resp 17   Ht 6' (1.829 m)   Wt 253 lb 3.2 oz (114.9 kg)   SpO2 97%   BMI 34.34 kg/m  General Appearance: Well nourished, well dressed adults in no apparent distress.  Eyes: PERRLA, EOMs, conjunctiva no swelling or erythema, normal fundi and vessels.  Sinuses: No Frontal/maxillary tenderness  ENT/Mouth: Ext aud canals clear, normal light reflex with TMs without erythema, bulging. Good dentition. No erythema, swelling, or exudate on post pharynx. Tonsils not swollen or erythematous. Hearing normal.  Neck: Supple, thyroid normal. No bruits  Respiratory: Respiratory effort normal, BS equal  bilaterally without rales, rhonchi, wheezing or stridor.  Cardio: RRR without murmurs, rubs or gallops. Brisk peripheral pulses without edema.  Chest: symmetric, with normal excursions and percussion.  Abdomen: Soft, nontender, no guarding, rebound, he has nontender very small umbilical hernia with bearing down, no palpable masses or organomegaly.  Lymphatics: Non tender without lymphadenopathy.  Genitourinary: Declines, doing regular self checks, no concerns.  Musculoskeletal: Full ROM all peripheral extremities; 5/5 strength, and normal gait.  Skin: Warm, dry without concerning lesions (numerous small benign appearing), ecchymosis. He has scattered erythematous rash to bil feet dorsal and interdigital.  Neuro: Cranial nerves intact, reflexes equal bilaterally. Normal muscle tone, no cerebellar symptoms. Sensation intact.  Psych: Awake and oriented X 3, normal affect, Insight and Judgment appropriate.    Darrol Jump, FNP 9:36 AM [facility]

## 2022-05-04 NOTE — Patient Instructions (Signed)

## 2022-05-08 ENCOUNTER — Ambulatory Visit (INDEPENDENT_AMBULATORY_CARE_PROVIDER_SITE_OTHER): Payer: 59 | Admitting: Orthopaedic Surgery

## 2022-05-08 ENCOUNTER — Encounter: Payer: Self-pay | Admitting: Orthopaedic Surgery

## 2022-05-08 ENCOUNTER — Ambulatory Visit (INDEPENDENT_AMBULATORY_CARE_PROVIDER_SITE_OTHER): Payer: 59

## 2022-05-08 DIAGNOSIS — Z96642 Presence of left artificial hip joint: Secondary | ICD-10-CM | POA: Diagnosis not present

## 2022-05-08 NOTE — Progress Notes (Signed)
The patient is now getting close to 7 months status post a left total hip arthroplasty to treat significant left hip arthritis.  He does report improvements in range of motion but still some muscle pain and numbness.  He is an active 51 year old gentleman.  On exam his left hip moves smoothly and fluidly.  There is still some numbness around the incision but overall he is improved significantly.  His leg lengths are equal.  An AP pelvis and lateral of the left hip shows a well-seated total hip arthroplasty with no complicating features.  At this point follow-up can be as needed.  If he does develop any issues or things or not getting better at all he needs to let us know.  All questions and concerns were addressed and answered.

## 2022-07-04 LAB — HM COLONOSCOPY

## 2022-08-31 ENCOUNTER — Encounter: Payer: Self-pay | Admitting: Radiology

## 2022-09-05 ENCOUNTER — Encounter: Payer: Self-pay | Admitting: Internal Medicine

## 2022-11-02 ENCOUNTER — Encounter: Payer: Self-pay | Admitting: Nurse Practitioner

## 2022-11-02 ENCOUNTER — Ambulatory Visit (INDEPENDENT_AMBULATORY_CARE_PROVIDER_SITE_OTHER): Payer: 59 | Admitting: Nurse Practitioner

## 2022-11-02 VITALS — BP 120/80 | HR 69 | Temp 97.9°F | Resp 16 | Ht 72.0 in | Wt 251.8 lb

## 2022-11-02 DIAGNOSIS — I700 Atherosclerosis of aorta: Secondary | ICD-10-CM | POA: Diagnosis not present

## 2022-11-02 DIAGNOSIS — K429 Umbilical hernia without obstruction or gangrene: Secondary | ICD-10-CM

## 2022-11-02 DIAGNOSIS — N200 Calculus of kidney: Secondary | ICD-10-CM

## 2022-11-02 DIAGNOSIS — Z0001 Encounter for general adult medical examination with abnormal findings: Secondary | ICD-10-CM

## 2022-11-02 DIAGNOSIS — Z0000 Encounter for general adult medical examination without abnormal findings: Secondary | ICD-10-CM | POA: Diagnosis not present

## 2022-11-02 DIAGNOSIS — Z1389 Encounter for screening for other disorder: Secondary | ICD-10-CM

## 2022-11-02 DIAGNOSIS — Z96642 Presence of left artificial hip joint: Secondary | ICD-10-CM

## 2022-11-02 DIAGNOSIS — E559 Vitamin D deficiency, unspecified: Secondary | ICD-10-CM

## 2022-11-02 DIAGNOSIS — E785 Hyperlipidemia, unspecified: Secondary | ICD-10-CM

## 2022-11-02 DIAGNOSIS — Z8249 Family history of ischemic heart disease and other diseases of the circulatory system: Secondary | ICD-10-CM

## 2022-11-02 DIAGNOSIS — Z136 Encounter for screening for cardiovascular disorders: Secondary | ICD-10-CM | POA: Diagnosis not present

## 2022-11-02 DIAGNOSIS — Z125 Encounter for screening for malignant neoplasm of prostate: Secondary | ICD-10-CM

## 2022-11-02 DIAGNOSIS — E669 Obesity, unspecified: Secondary | ICD-10-CM

## 2022-11-02 DIAGNOSIS — I10 Essential (primary) hypertension: Secondary | ICD-10-CM

## 2022-11-02 DIAGNOSIS — Z79899 Other long term (current) drug therapy: Secondary | ICD-10-CM

## 2022-11-02 DIAGNOSIS — Z808 Family history of malignant neoplasm of other organs or systems: Secondary | ICD-10-CM

## 2022-11-02 DIAGNOSIS — Z131 Encounter for screening for diabetes mellitus: Secondary | ICD-10-CM

## 2022-11-02 DIAGNOSIS — N400 Enlarged prostate without lower urinary tract symptoms: Secondary | ICD-10-CM

## 2022-11-02 NOTE — Patient Instructions (Signed)

## 2022-11-02 NOTE — Progress Notes (Signed)
Complete Physical  Assessment and Plan:  Chanon was seen today for annual exam.  Diagnoses and all orders for this visit:  Encounter for Annual Physical Exam with abnormal findings Due annually  Health Maintenance reviewed Healthy lifestyle reviewed and goals set  Obesity (BMI 30.0-34.9) Discussed appropriate BMI Diet modification. Physical activity. Encouraged/praised to build confidence.  Medication management All medications discussed and reviewed in full. All questions and concerns regarding medications addressed.    Hyperlipidemia Discussed lifestyle modifications. Recommended diet heavy in fruits and veggies, omega 3's. Decrease consumption of animal meats, cheeses, and dairy products. Remain active and exercise as tolerated. Continue to monitor. Check lipids/TSH  Screening for diabetes mellitus Education: Reviewed 'ABCs' of diabetes management  Discussed goals to be met and/or maintained include A1C (<7) Blood pressure (<130/80) Cholesterol (LDL <70) Continue Eye Exam yearly  Continue Dental Exam Q6 mo Discussed dietary recommendations Discussed Physical Activity recommendations Check A1C  Vitamin D deficiency Continue supplement for goal of 60-100 Monitor Vitamin D levels  Kidney stones No recent issues - stable Several remaining per CT  Push water intake [facility] follows - has only followed once but will continue to follow PRN  Umbilical hernia Mild, stable, non-tender;  Reviewed s/s to monitor with ED precautions  Family hx of AAA - [place] screening done in office 10/28/2020, negative - non smoker, BP well controlled -    Family history malignant melanoma Follows with Dermatology No concerns  S/p L hip arthroplasty  Released from Orthopedics - Dr. Magnus Ivan No residual symptoms  Enlarged prostate/prostate cancer screening Denies sx/LUTS,  Continue annual PSA screening  Orders Placed This Encounter  Procedures   CBC with  Differential/Platelet   COMPLETE METABOLIC PANEL WITH GFR   Lipid panel   TSH   Hemoglobin A1c   Insulin, random   VITAMIN D 25 Hydroxy (Vit-D Deficiency, Fractures)   Urinalysis, Routine w reflex microscopic   Microalbumin / creatinine urine ratio   PSA   [place], retroperitnl abd,  ltd   EKG 12-Lead    Notify office for further evaluation and treatment, questions or concerns if any reported s/s fail to improve.   The patient was advised to call back or seek an in-person evaluation if any symptoms worsen or if the condition fails to improve as anticipated.   Further disposition pending results of labs. Discussed med's effects and SE's.    I discussed the assessment and treatment plan with the patient. The patient was provided an opportunity to ask questions and all were answered. The patient agreed with the plan and demonstrated an understanding of the instructions.  Discussed med's effects and SE's. Screening labs and tests as requested with regular follow-up as recommended.  I provided 40 minutes of face-to-face time during this encounter including counseling, chart review, and critical decision making was preformed.  Today's Plan of Care is based on a patient-centered health care approach known as shared decision making - the decisions, tests and treatments allow for patient preferences and values to be balanced with clinical evidence.    Future Appointments  Date Time Provider Department Center  11/02/2023 10:00 AM Adela Glimpse, NP [facility] None   HPI 52 y.o. male patient presents for a complete physical. He has Umbilical hernia - 5mm; Obesity (BMI 30.0-34.9); Kidney stones; Hyperlipidemia; Enlarged prostate; Renal cyst; and Status post left hip replacement on their problem list.   He is married to [employer] who also comes here. 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     Saw [facility] 07/04/22 for colonoscopy.   Negative with 10 year recall.  On 09/23/2021 underwent L anterior hip arthroplasty by Dr. Doneen Poisson, last seen 04/2022 for follow up.  Reported improvements in ROM but stated some muscle pain and numbness. Denies concerns, no pain.  Follow up is now PRN.  Hx of kidney stones; he presented to ED 06/11/2021 with flank pain and gross hematuria and was found to have non-obstructing stone which he passed spontaneously without complication. CT at that time also showed numerous other small stones bilaterally and L parapelvic cyst within the lower pole the left kidney and enlarged prostate, no mention of complex cyst. Established with [facility] for PRN follow up.   BMI is Body mass index is 34.15 kg/m., he has been working on diet and exercise,  Wt Readings from Last 3 Encounters:  11/02/22 251 lb 12.8 oz (114.2 kg)  05/04/22 253 lb 3.2 oz (114.9 kg)  11/01/21 255 lb (115.7 kg)   His blood pressure has been controlled at home, today their BP is BP: 120/80 He does workout. He denies chest pain, shortness of breath, dizziness.    He is not on cholesterol medication and denies myalgias. His cholesterol is not at goal of LDL <100. The cholesterol last visit was:   Lab Results  Component Value Date   CHOL 218 (H) 05/04/2022   HDL 53 05/04/2022   LDLCALC 136 (H) 05/04/2022   TRIG 157 (H) 05/04/2022   CHOLHDL 4.1 05/04/2022    He has been working on diet and exercise for glucose management, and denies increased appetite, nausea, paresthesia of the feet, polydipsia, polyuria and visual disturbances. Last A1C in the office was:  Lab Results  Component Value Date   HGBA1C 5.1 05/14/2018    Last GFR:  Lab Results  Component Value Date   EGFR 83 05/04/2022   Patient is on Vitamin D supplement, taking 5000 IU daily.   Lab Results  Component Value Date   VD25OH 115 (H) 11/01/2021     He has enlarged prostate per CT abd 06/11/2021.  Denies LUTs, no family hx of prostate cancer.   Lab Results  Component Value Date   PSA 0.74 11/01/2021     Current Medications:  Current Outpatient Medications on File Prior to Visit  Medication Sig Dispense Refill   Cholecalciferol (VITAMIN D) 125 MCG (5000 UT) CAPS Take 10,000 Units by mouth at bedtime.     Cyanocobalamin (VITAMIN B-12 PO) Take 1 tablet by mouth at bedtime. gummy     Multiple Vitamins-Minerals (AIRBORNE) CHEW Chew 3 tablets by mouth at bedtime.     No current facility-administered medications on file prior to visit.   Allergies:  Not on File Health Maintenance:   There is no immunization history on file for this patient.  Health Maintenance  Topic Date Due   COVID-19 Vaccine (1) Never done   DTaP/Tdap/Td (1 - Tdap) Never done   Zoster Vaccines- Shingrix (1 of 2) Never done   INFLUENZA VACCINE  01/25/2023   COLONOSCOPY (Pts 45-59yrs Insurance coverage will need to be confirmed)  07/04/2032   HIV Screening  Completed   HPV VACCINES  Aged Out   Hepatitis C Screening  Discontinued   Flu vaccine: declines Shingrix: declines Covid 19: declines   Colonoscopy: 06/2022 Recall 10 years EGD: -  Eye Exam: Dr. Nedra Hai at [facility] on [address], last visit 08/2022,  Dentist: Dr.  Autumn Patty, last visit 2024, goes q41m  [facility]   Patient Care Team: Lucky Cowboy, MD as PCP - General (Internal Medicine)  Medical History:  has Umbilical hernia - 5mm; Obesity (BMI 30.0-34.9); Kidney stones; Hyperlipidemia; Enlarged prostate; Renal cyst; and Status post left hip replacement on their problem list. Surgical History:  He  has a past surgical history that includes Incision and drainage abscess / hematoma of bursa / knee / thigh (Right, ?2004); Wisdom tooth extraction (1985); and Total hip arthroplasty (Left, 09/23/2021). Family History:  His family history includes AAA (abdominal aortic aneurysm) in his maternal grandmother; Asthma in his mother; Cancer in his paternal grandmother;  Fibromyalgia in his mother; Heart failure (age of onset: 41) in his paternal grandfather; Hyperlipidemia in his mother; Lung cancer in his maternal grandfather; Melanoma in his father. Social History:   reports that he quit smoking about 30 years ago. His smoking use included cigarettes. He has a 3.00 pack-year smoking history. He quit smokeless tobacco use about 7 years ago.  His smokeless tobacco use included snuff. He reports that he does not drink alcohol and does not use drugs.  Review of Systems:  Review of Systems  Constitutional:  Negative for malaise/fatigue and weight loss.  HENT:  Negative for hearing loss and tinnitus.   Eyes:  Negative for blurred vision and double vision.  Respiratory:  Negative for cough, sputum production, shortness of breath and wheezing.   Cardiovascular:  Negative for chest pain, palpitations, orthopnea, claudication, leg swelling and PND.  Gastrointestinal:  Negative for abdominal pain, blood in stool, constipation, diarrhea, heartburn, melena, nausea and vomiting.  Genitourinary: Negative.   Musculoskeletal:  Positive for joint pain (left hip, 12+ months ). Negative for falls and myalgias.  Skin:  Negative for rash.  Neurological:  Negative for dizziness, tingling, sensory change, weakness and headaches.  Endo/Heme/Allergies:  Negative for polydipsia.  Psychiatric/Behavioral: Negative.  Negative for depression, memory loss, substance abuse and suicidal ideas. The patient is not nervous/anxious and does not have insomnia.   All other systems reviewed and are negative.   Physical Exam: Estimated body mass index is 34.15 kg/m as calculated from the following:   Height as of this encounter: 6' (1.829 m).   Weight as of this encounter: 251 lb 12.8 oz (114.2 kg). BP 120/80   Pulse 69   Temp 97.9 F (36.6 C)   Resp 16   Ht 6' (1.829 m)   Wt 251 lb 12.8 oz (114.2 kg)   SpO2 97%   BMI 34.15 kg/m   General Appearance: Well nourished, well dressed  adults in no apparent distress.  Eyes: PERRLA, EOMs, conjunctiva no swelling or erythema, normal fundi and vessels.  Sinuses: No Frontal/maxillary tenderness  ENT/Mouth: Ext aud canals clear, normal light reflex with TMs without erythema, bulging. Good dentition. No erythema, swelling, or exudate on post pharynx. Tonsils not swollen or erythematous. Hearing normal.  Neck: Supple, thyroid normal. No bruits  Respiratory: Respiratory effort normal, BS equal bilaterally without rales, rhonchi, wheezing or stridor.  Cardio: RRR without murmurs, rubs or gallops. Brisk peripheral pulses without edema.  Chest: symmetric, with normal excursions and percussion.  Abdomen: Soft, nontender, no guarding, rebound, he has nontender very small umbilical hernia with bearing down, no palpable masses or organomegaly.  Lymphatics: Non tender without lymphadenopathy.  Genitourinary: Declines, doing regular self checks, no concerns.  Musculoskeletal: Full ROM all peripheral extremities; 5/5 strength, and normal gait.  Skin: Warm, dry  without concerning lesions (numerous small benign appearing), ecchymosis. He has scattered erythematous rash to bil feet dorsal and interdigital.  Neuro: Cranial nerves intact, reflexes equal bilaterally. Normal muscle tone, no cerebellar symptoms. Sensation intact.  Psych: Awake and oriented X 3, normal affect, Insight and Judgment appropriate.   EKG: WNL, sinus rhythm   AAA [place]: <3cm, negative 10/28/2022  Daysia Vandenboom, DNP, AGNP-C 10:25 AM [facility]

## 2022-11-03 LAB — CBC WITH DIFFERENTIAL/PLATELET
Absolute Monocytes: 726 cells/uL (ref 200–950)
Basophils Absolute: 67 cells/uL (ref 0–200)
Basophils Relative: 1.1 %
Eosinophils Absolute: 189 cells/uL (ref 15–500)
Eosinophils Relative: 3.1 %
HCT: 43.8 % (ref 38.5–50.0)
Hemoglobin: 15.2 g/dL (ref 13.2–17.1)
Lymphs Abs: 2153 cells/uL (ref 850–3900)
MCH: 32.3 pg (ref 27.0–33.0)
MCHC: 34.7 g/dL (ref 32.0–36.0)
MCV: 93 fL (ref 80.0–100.0)
MPV: 10.2 fL (ref 7.5–12.5)
Monocytes Relative: 11.9 %
Neutro Abs: 2965 cells/uL (ref 1500–7800)
Neutrophils Relative %: 48.6 %
Platelets: 251 10*3/uL (ref 140–400)
RBC: 4.71 10*6/uL (ref 4.20–5.80)
RDW: 11.8 % (ref 11.0–15.0)
Total Lymphocyte: 35.3 %
WBC: 6.1 10*3/uL (ref 3.8–10.8)

## 2022-11-03 LAB — LIPID PANEL
Cholesterol: 195 mg/dL (ref <200)
HDL: 45 mg/dL (ref >=40)
LDL Cholesterol (Calc): 126 mg/dL (calc) — ABNORMAL HIGH
Non-HDL Cholesterol (Calc): 150 mg/dL (calc) — ABNORMAL HIGH (ref <130)
Total CHOL/HDL Ratio: 4.3 (calc) (ref <5.0)
Triglycerides: 126 mg/dL (ref <150)

## 2022-11-03 LAB — COMPLETE METABOLIC PANEL WITH GFR
AG Ratio: 1.8 (calc) (ref 1.0–2.5)
ALT: 25 U/L (ref 9–46)
AST: 17 U/L (ref 10–35)
Albumin: 4.3 g/dL (ref 3.6–5.1)
Alkaline phosphatase (APISO): 48 U/L (ref 35–144)
BUN: 24 mg/dL (ref 7–25)
CO2: 27 mmol/L (ref 20–32)
Calcium: 9.4 mg/dL (ref 8.6–10.3)
Chloride: 104 mmol/L (ref 98–110)
Creat: 1.17 mg/dL (ref 0.70–1.30)
Globulin: 2.4 g/dL (calc) (ref 1.9–3.7)
Glucose, Bld: 91 mg/dL (ref 65–99)
Potassium: 4.4 mmol/L (ref 3.5–5.3)
Sodium: 139 mmol/L (ref 135–146)
Total Bilirubin: 0.9 mg/dL (ref 0.2–1.2)
Total Protein: 6.7 g/dL (ref 6.1–8.1)
eGFR: 75 mL/min/{1.73_m2} (ref >=60)

## 2022-11-03 LAB — URINALYSIS, ROUTINE W REFLEX MICROSCOPIC
Bilirubin Urine: NEGATIVE
Glucose, UA: NEGATIVE
Hgb urine dipstick: NEGATIVE
Ketones, ur: NEGATIVE
Leukocytes,Ua: NEGATIVE
Nitrite: NEGATIVE
Protein, ur: NEGATIVE
Specific Gravity, Urine: 1.024 (ref 1.001–1.035)
pH: 7 (ref 5.0–8.0)

## 2022-11-03 LAB — VITAMIN D 25 HYDROXY (VIT D DEFICIENCY, FRACTURES): Vit D, 25-Hydroxy: 78 ng/mL (ref 30–100)

## 2022-11-03 LAB — MICROALBUMIN / CREATININE URINE RATIO
Creatinine, Urine: 189 mg/dL (ref 20–320)
Microalb Creat Ratio: 3 mg/g creat (ref <30)
Microalb, Ur: 0.5 mg/dL

## 2022-11-03 LAB — INSULIN, RANDOM: Insulin: 6.8 u[IU]/mL

## 2022-11-03 LAB — HEMOGLOBIN A1C
Hgb A1c MFr Bld: 5.2 % of total Hgb (ref <5.7)
Mean Plasma Glucose: 103 mg/dL
eAG (mmol/L): 5.7 mmol/L

## 2022-11-03 LAB — TSH: TSH: 0.87 mIU/L (ref 0.40–4.50)

## 2022-11-03 LAB — PSA: PSA: 0.54 ng/mL (ref <=4.00)

## 2023-03-24 IMAGING — CR DG HIP (WITH OR WITHOUT PELVIS) 2-3V*L*
2 series · 2 of 2 positions shown · non-contrast
Comparison: None.

CLINICAL DATA: Left hip pain for several months with decreased
range of motion

EXAM:
DG HIP (WITH OR WITHOUT PELVIS) 2V LEFT

[w hip ap left]
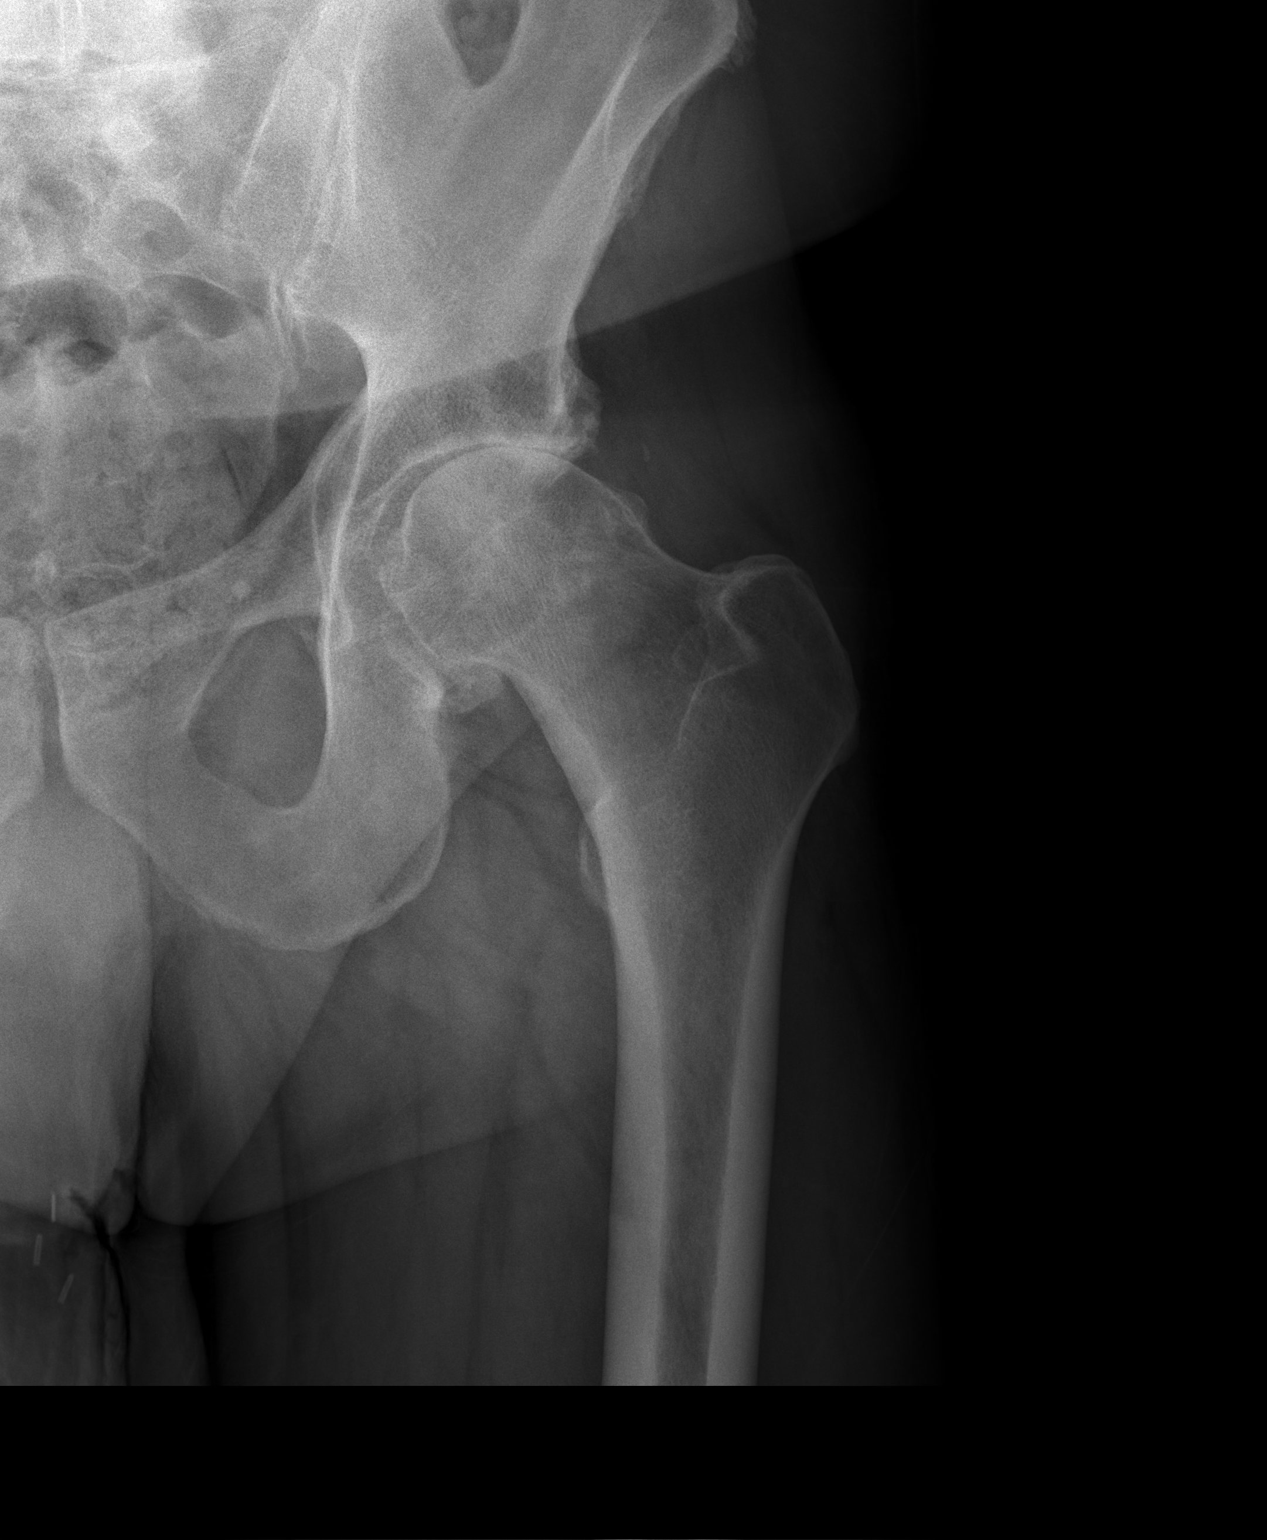

[w hip lat left]
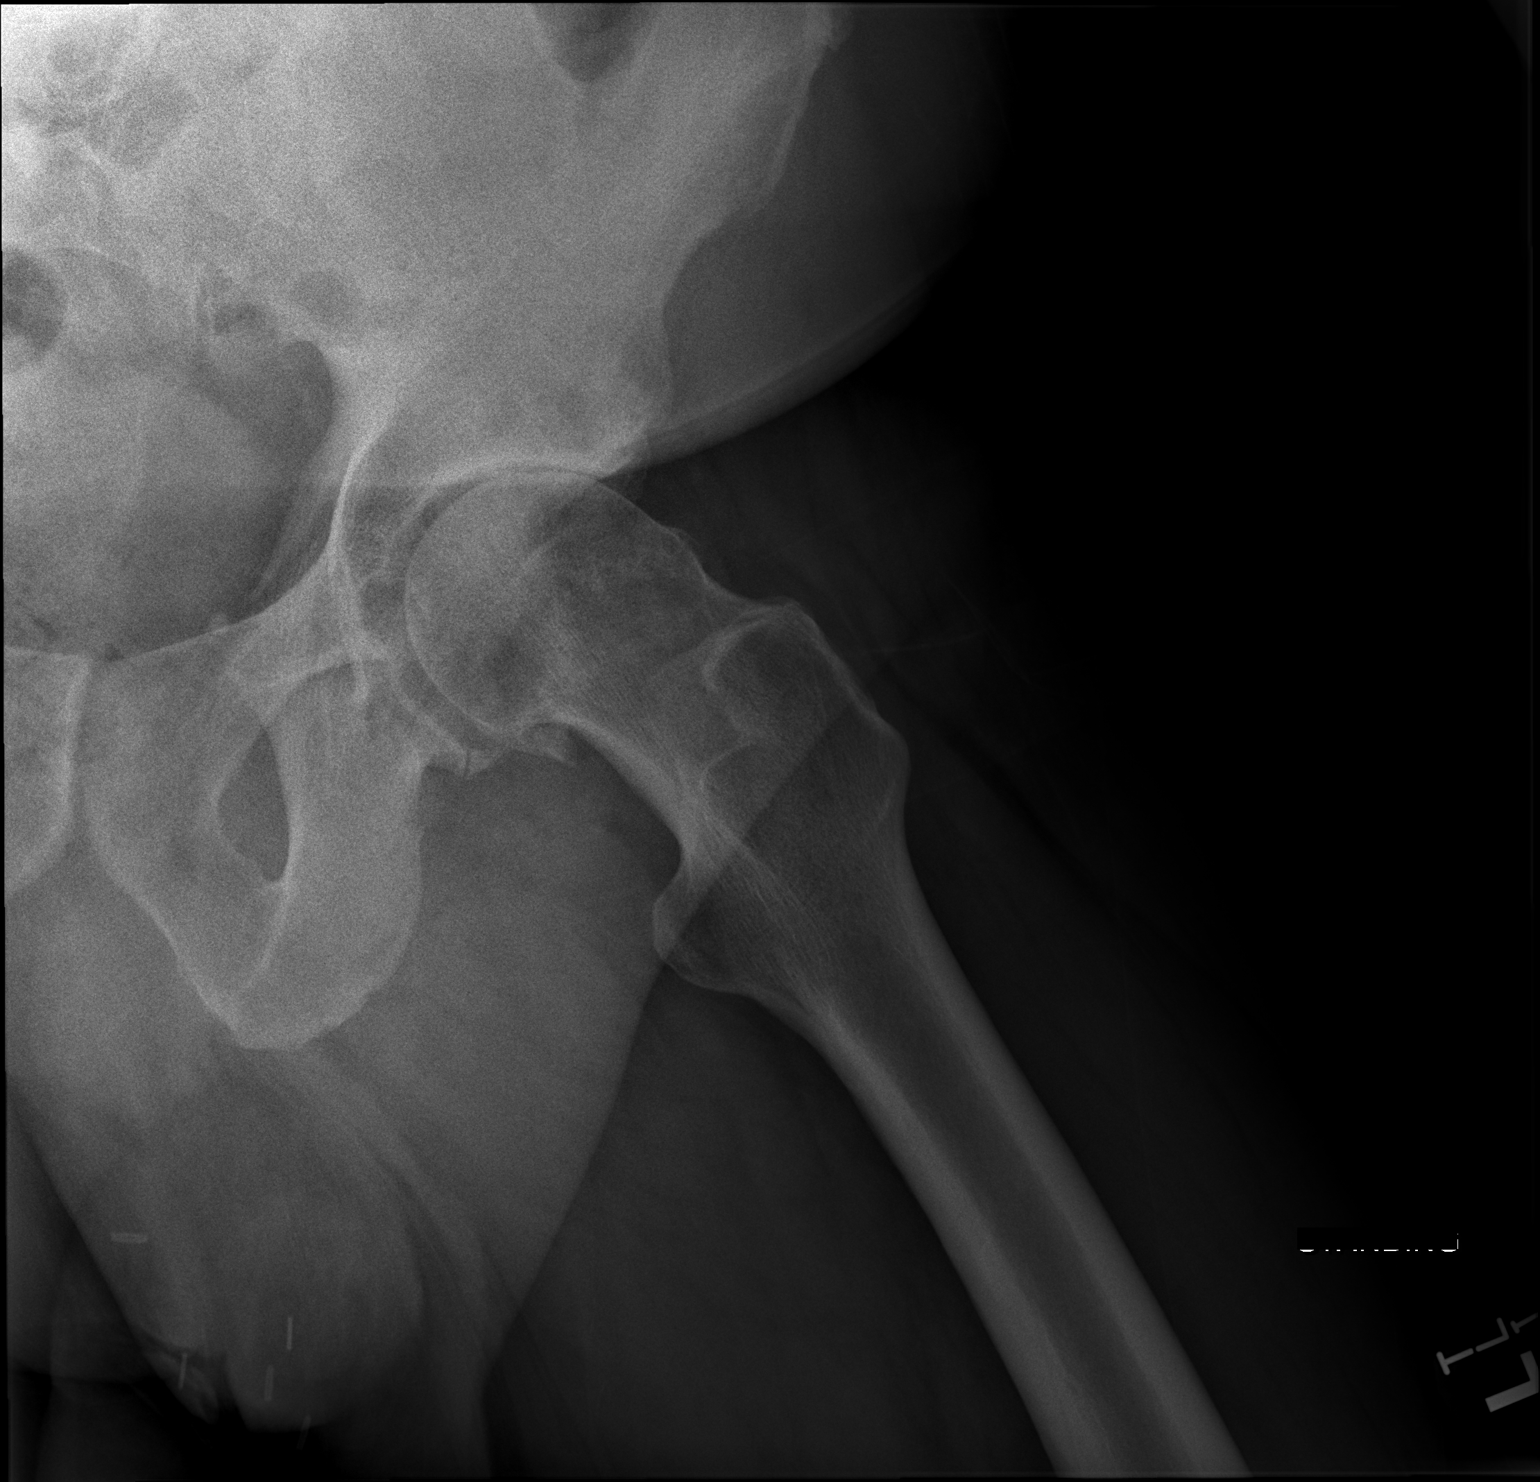

[2 of 2 positions shown; findings below may reference images not displayed]

FINDINGS: Visualized pelvic ring is intact. Degenerative changes of the hip
joint are noted with mild remodeling of the femoral head. Narrowing
of the joint space is noted superiorly. No soft tissue abnormality
is noted.
IMPRESSION: Degenerative change without acute abnormality.

## 2023-05-08 ENCOUNTER — Encounter: Payer: Self-pay | Admitting: Nurse Practitioner

## 2023-05-08 ENCOUNTER — Ambulatory Visit (INDEPENDENT_AMBULATORY_CARE_PROVIDER_SITE_OTHER): Payer: 59 | Admitting: Nurse Practitioner

## 2023-05-08 VITALS — BP 110/78 | HR 80 | Temp 97.9°F | Resp 16 | Ht 72.0 in | Wt 243.8 lb

## 2023-05-08 DIAGNOSIS — K429 Umbilical hernia without obstruction or gangrene: Secondary | ICD-10-CM

## 2023-05-08 DIAGNOSIS — N200 Calculus of kidney: Secondary | ICD-10-CM

## 2023-05-08 DIAGNOSIS — E559 Vitamin D deficiency, unspecified: Secondary | ICD-10-CM

## 2023-05-08 DIAGNOSIS — E785 Hyperlipidemia, unspecified: Secondary | ICD-10-CM | POA: Diagnosis not present

## 2023-05-08 DIAGNOSIS — Z96642 Presence of left artificial hip joint: Secondary | ICD-10-CM

## 2023-05-08 DIAGNOSIS — Z79899 Other long term (current) drug therapy: Secondary | ICD-10-CM | POA: Diagnosis not present

## 2023-05-08 DIAGNOSIS — E66811 Obesity, class 1: Secondary | ICD-10-CM

## 2023-05-08 NOTE — Patient Instructions (Signed)

## 2023-05-08 NOTE — Progress Notes (Signed)
Complete Physical  Assessment and Plan:  Khamoni was seen today for annual exam.  Diagnoses and all orders for this visit:  Obesity (BMI 30.0-34.9) Trending down Discussed appropriate BMI Diet modification. Physical activity. Encouraged/praised to build confidence.  Medication management All medications discussed and reviewed in full. All questions and concerns regarding medications addressed.    Hyperlipidemia Discussed lifestyle modifications. Recommended diet heavy in fruits and veggies, omega 3's. Decrease consumption of animal meats, cheeses, and dairy products. Remain active and exercise as tolerated. Continue to monitor. Check lipids  Vitamin D deficiency Continue supplement for goal of 60-100 Monitor Vitamin D levels  Kidney stones No recent issues - stable Several remaining per CT  Push water intake [facility] follows - has only followed once but will continue to follow PRN  Umbilical hernia Mild, stable, non-tender;  Reviewed s/s to monitor with ED precautions  Vitamin D deficiency Continue supplement for goal of 60-100 Monitor Vitamin D levels  S/P Left Hip Replacement No issues - release from Orthopedics Continue to monitor  Orders Placed This Encounter  Procedures   CBC with Differential/Platelet   COMPLETE METABOLIC PANEL WITH GFR   Lipid panel   Notify office for further evaluation and treatment, questions or concerns if any reported s/s fail to improve.   The patient was advised to call back or seek an in-person evaluation if any symptoms worsen or if the condition fails to improve as anticipated.   Further disposition pending results of labs. Discussed med's effects and SE's.    I discussed the assessment and treatment plan with the patient. The patient was provided an opportunity to ask questions and all were answered. The patient agreed with the plan and demonstrated an understanding of the instructions.  Discussed med's effects and  SE's. Screening labs and tests as requested with regular follow-up as recommended.  I provided 30 minutes of face-to-face time during this encounter including counseling, chart review, and critical decision making was preformed.  Today's Plan of Care is based on a patient-centered health care approach known as shared decision making - the decisions, tests and treatments allow for patient preferences and values to be balanced with clinical evidence.    Future Appointments  Date Time Provider Department Center  11/02/2023 10:00 AM Adela Glimpse, NP [facility] None   HPI 52 y.o. male patient presents for a general follow up. He has Umbilical hernia - 5mm; Obesity (BMI 30.0-34.9); Kidney stones; Hyperlipidemia; Enlarged prostate; Renal cyst; and Status post left hip replacement on their problem list.   He is married to [employer] who also comes here. 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     Saw [facility] 07/04/22 for colonoscopy.  Negative with 10 year recall.  On 09/23/2021 underwent L anterior hip arthroplasty by Dr. Doneen Poisson, last seen 04/2022 for follow up.  Reported improvements in ROM but stated some muscle pain and numbness. Denies concerns, no pain.  Follow up is now PRN.  Hx of kidney stones; he presented to ED 06/11/2021 with flank pain and gross hematuria and was found to have non-obstructing stone which he passed spontaneously without complication. CT at that time also showed numerous other small stones bilaterally and L parapelvic cyst within the lower pole the left kidney and enlarged prostate, no mention of complex cyst. Established with [facility] for PRN follow up.   BMI is Body mass index is 33.07 kg/m., he has been working  on diet and exercise,  Wt Readings from Last 3 Encounters:  05/08/23 243 lb 12.8 oz (110.6 kg)  11/02/22 251 lb 12.8 oz (114.2 kg)  05/04/22 253 lb 3.2 oz (114.9 kg)    His blood pressure has been controlled at home, today their BP is BP: 110/78 He does workout. He denies chest pain, shortness of breath, dizziness.    He is not on cholesterol medication and denies myalgias. His cholesterol is not at goal of LDL <100. The cholesterol last visit was:   Lab Results  Component Value Date   CHOL 195 11/02/2022   HDL 45 11/02/2022   LDLCALC 126 (H) 11/02/2022   TRIG 126 11/02/2022   CHOLHDL 4.3 11/02/2022    He has been working on diet and exercise for glucose management, and denies increased appetite, nausea, paresthesia of the feet, polydipsia, polyuria and visual disturbances. Last A1C in the office was:  Lab Results  Component Value Date   HGBA1C 5.2 11/02/2022    Last GFR:  Lab Results  Component Value Date   EGFR 75 11/02/2022   Patient is on Vitamin D supplement, taking 5000 IU daily.   Lab Results  Component Value Date   VD25OH 78 11/02/2022     He has enlarged prostate per CT abd 06/11/2021.  Denies LUTs, no family hx of prostate cancer.  Lab Results  Component Value Date   PSA 0.54 11/02/2022   PSA 0.74 11/01/2021     Current Medications:  Current Outpatient Medications on File Prior to Visit  Medication Sig Dispense Refill   Cholecalciferol (VITAMIN D) 125 MCG (5000 UT) CAPS Take 10,000 Units by mouth at bedtime.     Cyanocobalamin (VITAMIN B-12 PO) Take 1 tablet by mouth at bedtime. gummy     Multiple Vitamins-Minerals (AIRBORNE) CHEW Chew 3 tablets by mouth at bedtime.     No current facility-administered medications on file prior to visit.   Allergies:  Not on File Health Maintenance:   There is no immunization history on file for this patient.  Health Maintenance  Topic Date Due   COVID-19 Vaccine (1) Never done   DTaP/Tdap/Td (1 - Tdap) Never done   Zoster Vaccines- Shingrix (1 of 2) Never done   INFLUENZA VACCINE  01/25/2023   Colonoscopy  07/04/2032   HIV Screening  Completed   HPV VACCINES  Aged Out    Hepatitis C Screening  Discontinued    Patient Care Team: Lucky Cowboy, MD as PCP - General (Internal Medicine)  Medical History:  has Umbilical hernia - 5mm; Obesity (BMI 30.0-34.9); Kidney stones; Hyperlipidemia; Enlarged prostate; Renal cyst; and Status post left hip replacement on their problem list. Surgical History:  He  has a past surgical history that includes Incision and drainage abscess / hematoma of bursa / knee / thigh (Right, ?2004); Wisdom tooth extraction (1985); and Total hip arthroplasty (Left, 09/23/2021). Family History:  His family history includes AAA (abdominal aortic aneurysm) in his maternal grandmother; Asthma in his mother; Cancer in his paternal grandmother; Fibromyalgia in his mother; Heart failure (age of onset: 89) in his paternal grandfather; Hyperlipidemia in his mother; Lung cancer in his maternal grandfather; Melanoma in his father. Social History:   reports that he quit smoking about 31 years ago. His smoking use included cigarettes. He started smoking about 34 years ago. He has a 3 pack-year smoking history. He quit smokeless tobacco use about 7 years ago.  His smokeless tobacco use included snuff. He reports that he does  not drink alcohol and does not use drugs.  Review of Systems:  Review of Systems  Constitutional:  Negative for malaise/fatigue and weight loss.  HENT:  Negative for hearing loss and tinnitus.   Eyes:  Negative for blurred vision and double vision.  Respiratory:  Negative for cough, sputum production, shortness of breath and wheezing.   Cardiovascular:  Negative for chest pain, palpitations, orthopnea, claudication, leg swelling and PND.  Gastrointestinal:  Negative for abdominal pain, blood in stool, constipation, diarrhea, heartburn, melena, nausea and vomiting.  Genitourinary: Negative.   Musculoskeletal:  Positive for joint pain (left hip, 12+ months ). Negative for falls and myalgias.  Skin:  Negative for rash.  Neurological:   Negative for dizziness, tingling, sensory change, weakness and headaches.  Endo/Heme/Allergies:  Negative for polydipsia.  Psychiatric/Behavioral: Negative.  Negative for depression, memory loss, substance abuse and suicidal ideas. The patient is not nervous/anxious and does not have insomnia.   All other systems reviewed and are negative.   Physical Exam: Estimated body mass index is 33.07 kg/m as calculated from the following:   Height as of this encounter: 6' (1.829 m).   Weight as of this encounter: 243 lb 12.8 oz (110.6 kg). BP 110/78   Pulse 80   Temp 97.9 F (36.6 C)   Resp 16   Ht 6' (1.829 m)   Wt 243 lb 12.8 oz (110.6 kg)   SpO2 97%   BMI 33.07 kg/m   General Appearance: Well nourished, well dressed adults in no apparent distress.  Eyes: PERRLA, EOMs, conjunctiva no swelling or erythema, normal fundi and vessels.  Sinuses: No Frontal/maxillary tenderness  ENT/Mouth: Ext aud canals clear, normal light reflex with TMs without erythema, bulging. Good dentition. No erythema, swelling, or exudate on post pharynx. Tonsils not swollen or erythematous. Hearing normal.  Neck: Supple, thyroid normal. No bruits  Respiratory: Respiratory effort normal, BS equal bilaterally without rales, rhonchi, wheezing or stridor.  Cardio: RRR without murmurs, rubs or gallops. Brisk peripheral pulses without edema.  Chest: symmetric, with normal excursions and percussion.  Abdomen: Soft, nontender, no guarding, rebound, he has nontender very small umbilical hernia with bearing down, no palpable masses or organomegaly.  Lymphatics: Non tender without lymphadenopathy.  Genitourinary: Declines, doing regular self checks, no concerns.  Musculoskeletal: Full ROM all peripheral extremities; 5/5 strength, and normal gait.  Skin: Warm, dry without concerning lesions (numerous small benign appearing), ecchymosis. He has scattered erythematous rash to bil feet dorsal and interdigital.  Neuro: Cranial nerves  intact, reflexes equal bilaterally. Normal muscle tone, no cerebellar symptoms. Sensation intact.  Psych: Awake and oriented X 3, normal affect, Insight and Judgment appropriate.    Adela Glimpse, FNP-C 1:27 PM [facility]

## 2023-05-09 LAB — COMPLETE METABOLIC PANEL WITH GFR
AG Ratio: 1.9 (calc) (ref 1.0–2.5)
ALT: 21 U/L (ref 9–46)
AST: 17 U/L (ref 10–35)
Albumin: 4.6 g/dL (ref 3.6–5.1)
Alkaline phosphatase (APISO): 53 U/L (ref 35–144)
BUN: 23 mg/dL (ref 7–25)
CO2: 26 mmol/L (ref 20–32)
Calcium: 9.7 mg/dL (ref 8.6–10.3)
Chloride: 104 mmol/L (ref 98–110)
Creat: 0.98 mg/dL (ref 0.70–1.30)
Globulin: 2.4 g/dL (ref 1.9–3.7)
Glucose, Bld: 89 mg/dL (ref 65–99)
Potassium: 4.9 mmol/L (ref 3.5–5.3)
Sodium: 138 mmol/L (ref 135–146)
Total Bilirubin: 1.1 mg/dL (ref 0.2–1.2)
Total Protein: 7 g/dL (ref 6.1–8.1)
eGFR: 92 mL/min/{1.73_m2} (ref >=60)

## 2023-05-09 LAB — LIPID PANEL
Cholesterol: 200 mg/dL — ABNORMAL HIGH (ref <200)
HDL: 51 mg/dL (ref >=40)
LDL Cholesterol (Calc): 128 mg/dL — ABNORMAL HIGH
Non-HDL Cholesterol (Calc): 149 mg/dL — ABNORMAL HIGH (ref <130)
Total CHOL/HDL Ratio: 3.9 (calc) (ref <5.0)
Triglycerides: 107 mg/dL (ref <150)

## 2023-05-09 LAB — CBC WITH DIFFERENTIAL/PLATELET
Absolute Lymphocytes: 2394 {cells}/uL (ref 850–3900)
Absolute Monocytes: 859 {cells}/uL (ref 200–950)
Basophils Absolute: 68 {cells}/uL (ref 0–200)
Basophils Relative: 0.9 %
Eosinophils Absolute: 198 {cells}/uL (ref 15–500)
Eosinophils Relative: 2.6 %
HCT: 46.1 % (ref 38.5–50.0)
Hemoglobin: 15.6 g/dL (ref 13.2–17.1)
MCH: 32.4 pg (ref 27.0–33.0)
MCHC: 33.8 g/dL (ref 32.0–36.0)
MCV: 95.8 fL (ref 80.0–100.0)
MPV: 10.8 fL (ref 7.5–12.5)
Monocytes Relative: 11.3 %
Neutro Abs: 4081 {cells}/uL (ref 1500–7800)
Neutrophils Relative %: 53.7 %
Platelets: 254 10*3/uL (ref 140–400)
RBC: 4.81 10*6/uL (ref 4.20–5.80)
RDW: 11.8 % (ref 11.0–15.0)
Total Lymphocyte: 31.5 %
WBC: 7.6 10*3/uL (ref 3.8–10.8)

## 2023-09-21 ENCOUNTER — Ambulatory Visit: Payer: Self-pay

## 2023-09-21 NOTE — Telephone Encounter (Signed)
 Copied from CRM [id number]. Topic: Clinical - Red Word Triage >> Sep 21, 2023  7:41 AM Izetta Dakin wrote: [facility] that prompted transfer to Nurse Triage: Increased cough with mucous   Chief Complaint: Productive cough, sore throat. Has new patient appointment 10/03/23. 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 Symptoms: Above Frequency: This week Pertinent Negatives: Patient denies fever Disposition: [] ED /[] Urgent Care (no appt availability in office) / [] Appointment(In office/virtual)/ []  [place] [facility]/ [] Home Care/ [] Refused Recommended Disposition /[] [place] [facility]/ []  Follow-up with PCP Additional Notes: Please advise pt.  Reason for Disposition  [1] Continuous (nonstop) coughing interferes with work or school AND [2] no improvement using cough treatment per Care Advice  Answer Assessment - Initial Assessment Questions 1. ONSET: "When did the cough begin?"      Tuesday 2. SEVERITY: "How bad is the cough today?"      severe 3. SPUTUM: "Describe the color of your sputum" (none, dry cough; clear, white, yellow, green)     green 4. HEMOPTYSIS: "Are you coughing up any blood?" If so ask: "How much?" (flecks, streaks, tablespoons, etc.)     no 5. DIFFICULTY BREATHING: "Are you having difficulty breathing?" If Yes, ask: "How bad is it?" (e.g., mild, moderate, severe)    - MILD: No SOB at rest, mild SOB with walking, speaks normally in sentences, can lie down, no retractions, pulse < 100.    - MODERATE: SOB at rest, SOB with minimal exertion and prefers to sit, cannot lie down flat, speaks in phrases, mild retractions, audible wheezing, pulse 100-120.    - SEVERE: Very SOB at rest, speaks in single words, struggling to breathe, sitting hunched forward, retractions, pulse > 120      no 6. FEVER: "Do you have a fever?" If Yes, ask: "What is your temperature, how was it measured, and when did it start?"     no 7. CARDIAC HISTORY: "Do you have any history of heart  disease?" (e.g., heart attack, congestive heart failure)      no 8. LUNG HISTORY: "Do you have any history of lung disease?"  (e.g., pulmonary embolus, asthma, emphysema)     no 9. PE RISK FACTORS: "Do you have a history of blood clots?" (or: recent major surgery, recent prolonged travel, bedridden)     no 10. OTHER SYMPTOMS: "Do you have any other symptoms?" (e.g., runny nose, wheezing, chest pain)       wheezing 11. PREGNANCY: "Is there any chance you are pregnant?" "When was your last menstrual period?"       N/a 12. TRAVEL: "Have you traveled out of the country in the last month?" (e.g., travel history, exposures)       no  Protocols used: Cough - Acute Productive-A-AH

## 2023-09-21 NOTE — Telephone Encounter (Signed)
 Patient advised to go to UC for tx. Patient has appt with our office to re-establish care in April.

## 2023-10-03 ENCOUNTER — Ambulatory Visit (INDEPENDENT_AMBULATORY_CARE_PROVIDER_SITE_OTHER): Admitting: Physician Assistant

## 2023-10-03 ENCOUNTER — Encounter: Payer: Self-pay | Admitting: Physician Assistant

## 2023-10-03 VITALS — BP 112/62 | HR 78 | Temp 97.8°F | Ht 71.0 in | Wt 219.0 lb

## 2023-10-03 DIAGNOSIS — N400 Enlarged prostate without lower urinary tract symptoms: Secondary | ICD-10-CM | POA: Diagnosis not present

## 2023-10-03 DIAGNOSIS — Z96642 Presence of left artificial hip joint: Secondary | ICD-10-CM

## 2023-10-03 DIAGNOSIS — Z0000 Encounter for general adult medical examination without abnormal findings: Secondary | ICD-10-CM | POA: Diagnosis not present

## 2023-10-03 DIAGNOSIS — N200 Calculus of kidney: Secondary | ICD-10-CM | POA: Diagnosis not present

## 2023-10-03 DIAGNOSIS — E785 Hyperlipidemia, unspecified: Secondary | ICD-10-CM

## 2023-10-03 NOTE — Assessment & Plan Note (Signed)
 No complaints or concerns Labs drawn today Will adjust treatment depending on results

## 2023-10-03 NOTE — Assessment & Plan Note (Signed)
 Enlarged prostate without urinary symptoms. PSA testing needed to monitor prostate health. - Order PSA test to monitor prostate health.

## 2023-10-03 NOTE — Assessment & Plan Note (Signed)
 Resolved. No further issues with kidney stones.

## 2023-10-03 NOTE — Assessment & Plan Note (Signed)
 No complaints or concerns after replacement Has no current plans to replace other hip

## 2023-10-03 NOTE — Progress Notes (Signed)
 Subjective:  Patient ID: Timothy Newman, male    DOB: [birth year]  Age: 52 y.o. MRN: [id number]  Chief Complaint  Patient presents with   New to establish    Patient is establishing as a new patient.  Discussed the use of AI scribe software for clinical note transcription with the patient, who gave verbal consent to proceed.  History of Present Illness   Timothy Newman, a patient with a history of kidney stones and an enlarged prostate, presents for a routine physical to establish care with a new provider. He reports no specific complaints or concerns. He has recently lost significant weight, dropping from 247 pounds to his current weight over the past three months. This weight loss was achieved through dietary changes, including reducing his intake of high-calorie drinks and cookies, and eating health bars every three hours to boost metabolism. He denies taking any daily medications, but does take vitamins and Airborne. He has a history of smoking but quit over 30 years ago. He has not had his cholesterol checked since October of the previous year. His last kidney stone episode was over a year ago, during which he passed three stones. He denies any recent urinary issues or blood in the urine.           10/03/2023    3:09 PM 11/01/2021   10:31 AM 10/28/2020    3:24 PM 10/27/2019    3:18 PM 05/14/2018    3:06 PM  Depression screen PHQ 2/9  Decreased Interest 0 0 0 0 0  Down, Depressed, Hopeless 0 0 0 0 0  PHQ - 2 Score 0 0 0 0 0         09/23/2021    5:41 AM 09/23/2021   11:20 AM 09/23/2021    8:30 PM 09/24/2021   10:00 AM 10/03/2023    3:08 PM  Fall Risk  Falls in the past year?     0  Was there an injury with Fall?     0  Fall Risk Category Calculator     0  (RETIRED) Patient Fall Risk Level Low fall risk High fall risk High fall risk High fall risk   Patient at Risk for Falls Due to     No Fall Risks  Fall risk Follow up     Falls evaluation completed     Current Outpatient Medications  on File Prior to Visit  Medication Sig Dispense Refill   Multiple Vitamins-Minerals (AIRBORNE PO) Take by mouth.     VITAMIN D PO Take by mouth.     No current facility-administered medications on file prior to visit.  . Social History   Socioeconomic History   Marital status: Married    Spouse name: Not on file   Number of children: 2   Years of education: Not on file   Highest education level: Not on file  Occupational History   Not on file  Tobacco Use   Smoking status: Former    Current packs/day: 0.00    Average packs/day: 1 pack/day for 3.0 years (3.0 ttl pk-yrs)    Types: Cigarettes    Start date: 05/14/1989    Quit date: 05/14/1992    Years since quitting: 31.4   Smokeless tobacco: Former    Types: Snuff    Quit date: 05/15/2015  Vaping Use   Vaping status: Never Used  Substance and Sexual Activity   Alcohol use: No   Drug use: No   Sexual activity: Yes  Partners: Female    Birth control/protection: Surgical    Comment: wife s/p hysterectomy   Other Topics Concern   Not on file  Social History Narrative   Not on file   Social Drivers of Health   Financial Resource Strain: Not on file  Food Insecurity: Not on file  Transportation Needs: Not on file  Physical Activity: Insufficiently Active (05/14/2018)   Exercise Vital Sign    Days of Exercise per Week: 2 days    Minutes of Exercise per Session: 60 min  Stress: No Stress Concern Present (05/14/2018)   [employer] of Occupational Health - Occupational Stress Questionnaire    Feeling of Stress : Only a little  Social Connections: Not on file   Past Medical History:  Diagnosis Date   Arthritis    Family history of abdominal aortic aneurysm (AAA) 05/16/2019   History of kidney stones    Inflamed sebaceous cyst s/p excision Nov 2014 05/12/2013   MRSA cellulitis ?2004   I&D in ED.  IV Vanco w PICC   Primary osteoarthritis of left hip 11/03/2020   Umbilical hernia - 5mm 05/12/2013   Family  History  Problem Relation Age of Onset   Asthma Mother    Fibromyalgia Mother    Hyperlipidemia Mother    Melanoma Father        with brain mets   AAA (abdominal aortic aneurysm) Maternal Grandmother    Lung cancer Maternal Grandfather        worked in Dance movement psychotherapist   Cancer Paternal Grandmother    Heart failure Paternal Grandfather 24    Review of Systems  Constitutional:  Negative for appetite change, fatigue and fever.  HENT:  Negative for congestion, ear pain, sinus pressure and sore throat.   Respiratory:  Negative for cough, chest tightness, shortness of breath and wheezing.   Cardiovascular:  Negative for chest pain and palpitations.  Gastrointestinal:  Negative for abdominal pain, constipation, diarrhea, nausea and vomiting.  Genitourinary:  Negative for dysuria and hematuria.  Musculoskeletal:  Negative for arthralgias, back pain, joint swelling and myalgias.  Skin:  Negative for rash.  Neurological:  Negative for dizziness, weakness and headaches.  Psychiatric/Behavioral:  Negative for dysphoric mood. The patient is not nervous/anxious.      Objective:  BP 112/62 (BP Location: Left Arm, Patient Position: Sitting)   Pulse 78   Temp 97.8 F (36.6 C) (Temporal)   Ht 5\' 11"  (1.803 m)   Wt 219 lb (99.3 kg)   SpO2 98%   BMI 30.54 kg/m      10/03/2023    3:02 PM 05/08/2023    9:25 AM 11/02/2022   10:04 AM  BP/Weight  Systolic BP 112 110 120  Diastolic BP 62 78 80  Wt. (Lbs) 219 243.8 251.8  BMI 30.54 kg/m2 33.07 kg/m2 34.15 kg/m2    Physical Exam Noted in physical note   Diabetic Foot Exam - Simple   No data filed      Lab Results  Component Value Date   WBC 7.6 05/08/2023   HGB 15.6 05/08/2023   HCT 46.1 05/08/2023   PLT 254 05/08/2023   GLUCOSE 89 05/08/2023   CHOL 200 (H) 05/08/2023   TRIG 107 05/08/2023   HDL 51 05/08/2023   LDLCALC 128 (H) 05/08/2023   ALT 21 05/08/2023   AST 17 05/08/2023   NA 138 05/08/2023   K 4.9 05/08/2023   CL  104 05/08/2023   CREATININE 0.98 05/08/2023   BUN 23  05/08/2023   CO2 26 05/08/2023   TSH 0.87 11/02/2022   PSA 0.54 11/02/2022   HGBA1C 5.2 11/02/2022   MICROALBUR 0.5 11/02/2022      Assessment & Plan:   Hyperlipidemia, unspecified hyperlipidemia type -     Hemoglobin A1c -     Lipid panel -     T4, free -     TSH  Enlarged prostate Assessment & Plan: Enlarged prostate without urinary symptoms. PSA testing needed to monitor prostate health. - Order PSA test to monitor prostate health.  Orders: -     CBC with Differential/Platelet -     PSA  Kidney stones Assessment & Plan: Resolved. No further issues with kidney stones.  Orders: -     Comprehensive metabolic panel with GFR  Status post left hip replacement Assessment & Plan: No complaints or concerns after replacement Has no current plans to replace other hip       No orders of the defined types were placed in this encounter.    Orders Placed This Encounter  Procedures   Hemoglobin A1c   Comprehensive metabolic panel with GFR   CBC with Differential/Platelet   Lipid panel   T4, free   TSH   PSA    I,Lauren M Auman,acting as a scribe for [employer], PA.,have documented all relevant documentation on the behalf of Langley Gauss, PA,as directed by  Langley Gauss, PA while in the presence of Langley Gauss, [place].    Follow-up: Return in about 3 months (around 01/02/2024) for Chronic, Huston Foley.  AVS was given to patient prior to departure.  Langley Gauss, [place] [facility] [phone]

## 2023-10-03 NOTE — Progress Notes (Signed)
 Subjective:  Patient ID: Timothy Newman, male    DOB: 11/12/1970  Age: 53 y.o. MRN: [id number]  Chief Complaint  Patient presents with   New to establish   Discussed the use of AI scribe software for clinical note transcription with the patient, who gave verbal consent to proceed.  Well Adult Physical: Patient here for a comprehensive physical exam.The patient reports no problems Do you take any herbs or supplements that were not prescribed by a doctor? yes Are you taking calcium supplements? no Are you taking aspirin daily? no  Encounter for general adult medical examination without abnormal findings  Physical ("At Risk" items are starred): Patient's last physical exam was 1 year ago .  Patient wears a seat belt, has smoke detectors, has carbon monoxide detectors, practices appropriate gun safety, and wears sunscreen with extended sun exposure. Dental Care: biannual cleanings, brushes and flosses daily. Ophthalmology/Optometry: Annual visit.  Hearing loss: none Vision impairments: none Last PSA:     10/03/2023    3:09 PM 11/01/2021   10:31 AM 10/28/2020    3:24 PM 10/27/2019    3:18 PM 05/14/2018    3:06 PM  Depression screen PHQ 2/9  Decreased Interest 0 0 0 0 0  Down, Depressed, Hopeless 0 0 0 0 0  PHQ - 2 Score 0 0 0 0 0         09/23/2021    5:41 AM 09/23/2021   11:20 AM 09/23/2021    8:30 PM 09/24/2021   10:00 AM 10/03/2023    3:08 PM  Fall Risk  Falls in the past year?     0  Was there an injury with Fall?     0  Fall Risk Category Calculator     0  (RETIRED) Patient Fall Risk Level Low fall risk High fall risk High fall risk High fall risk   Patient at Risk for Falls Due to     No Fall Risks  Fall risk Follow up     Falls evaluation completed              Past Medical History:  Diagnosis Date   Arthritis    Family history of abdominal aortic aneurysm (AAA) 05/16/2019   History of kidney stones    Inflamed sebaceous cyst s/p excision Nov 2014 05/12/2013   MRSA  cellulitis ?2004   I&D in ED.  IV Vanco w PICC   Primary osteoarthritis of left hip 11/03/2020   Umbilical hernia - 5mm 05/12/2013   Past Surgical History:  Procedure Laterality Date   INCISION AND DRAINAGE ABSCESS / HEMATOMA OF BURSA / KNEE / THIGH Right ?2004   ?Dr Lucas Mallow    TOTAL HIP ARTHROPLASTY Left 09/23/2021   Procedure: Left TOTAL HIP ARTHROPLASTY ANTERIOR APPROACH;  Surgeon: Kathryne Hitch, MD;  Location: [facility];  Service: Orthopedics;  Laterality: Left;   WISDOM TOOTH EXTRACTION  1985    Family History  Problem Relation Age of Onset   Asthma Mother    Fibromyalgia Mother    Hyperlipidemia Mother    Melanoma Father        with brain mets   AAA (abdominal aortic aneurysm) Maternal Grandmother    Lung cancer Maternal Grandfather        worked in Dance movement psychotherapist   Cancer Paternal Grandmother    Heart failure Paternal Grandfather 90   Social History   Socioeconomic History   Marital status: Married    Spouse name: Not on file  Number of children: 2   Years of education: Not on file   Highest education level: Not on file  Occupational History   Not on file  Tobacco Use   Smoking status: Former    Current packs/day: 0.00    Average packs/day: 1 pack/day for 3.0 years (3.0 ttl pk-yrs)    Types: Cigarettes    Start date: 05/14/1989    Quit date: 05/14/1992    Years since quitting: 31.4   Smokeless tobacco: Former    Types: Snuff    Quit date: 05/15/2015  Vaping Use   Vaping status: Never Used  Substance and Sexual Activity   Alcohol use: No   Drug use: No   Sexual activity: Yes    Partners: Female    Birth control/protection: Surgical    Comment: wife s/p hysterectomy   Other Topics Concern   Not on file  Social History Narrative   Not on file   Social Drivers of Health   Financial Resource Strain: Not on file  Food Insecurity: Not on file  Transportation Needs: Not on file  Physical Activity: Insufficiently Active (05/14/2018)    Exercise Vital Sign    Days of Exercise per Week: 2 days    Minutes of Exercise per Session: 60 min  Stress: No Stress Concern Present (05/14/2018)   [employer] of Occupational Health - Occupational Stress Questionnaire    Feeling of Stress : Only a little  Social Connections: Not on file   Review of Systems  Constitutional:  Negative for chills, fatigue and fever.  HENT:  Negative for congestion, ear pain and sore throat.   Respiratory:  Negative for cough and shortness of breath.   Cardiovascular:  Negative for chest pain and palpitations.  Gastrointestinal:  Negative for abdominal pain, constipation, diarrhea, nausea and vomiting.  Genitourinary:  Negative for difficulty urinating and dysuria.  Musculoskeletal:  Negative for arthralgias, back pain and myalgias.  Skin:  Negative for rash.  Neurological:  Negative for dizziness and headaches.  Psychiatric/Behavioral:  Negative for dysphoric mood.      Objective:  BP 112/62 (BP Location: Left Arm, Patient Position: Sitting)   Pulse 78   Temp 97.8 F (36.6 C) (Temporal)   Ht 5\' 11"  (1.803 m)   Wt 219 lb (99.3 kg)   SpO2 98%   BMI 30.54 kg/m      10/03/2023    3:02 PM 05/08/2023    9:25 AM 11/02/2022   10:04 AM  BP/Weight  Systolic BP 112 110 120  Diastolic BP 62 78 80  Wt. (Lbs) 219 243.8 251.8  BMI 30.54 kg/m2 33.07 kg/m2 34.15 kg/m2    Physical Exam Constitutional:      Appearance: Normal appearance.  HENT:     Right Ear: Tympanic membrane normal.     Left Ear: Tympanic membrane normal.     Nose: Nose normal.     Mouth/Throat:     Pharynx: No oropharyngeal exudate or posterior oropharyngeal erythema.  Eyes:     Conjunctiva/sclera: Conjunctivae normal.  Neck:     Vascular: No carotid bruit.  Cardiovascular:     Rate and Rhythm: Normal rate and regular rhythm.     Heart sounds: Normal heart sounds.  Pulmonary:     Effort: Pulmonary effort is normal.     Breath sounds: Normal breath sounds.  Abdominal:      General: Bowel sounds are normal.     Palpations: Abdomen is soft.     Tenderness: There is no  abdominal tenderness.  Skin:    Findings: No lesion or rash.  Neurological:     Mental Status: He is alert and oriented to person, place, and time.  Psychiatric:        Behavior: Behavior normal.     Lab Results  Component Value Date   WBC 7.6 05/08/2023   HGB 15.6 05/08/2023   HCT 46.1 05/08/2023   PLT 254 05/08/2023   GLUCOSE 89 05/08/2023   CHOL 200 (H) 05/08/2023   TRIG 107 05/08/2023   HDL 51 05/08/2023   LDLCALC 128 (H) 05/08/2023   ALT 21 05/08/2023   AST 17 05/08/2023   NA 138 05/08/2023   K 4.9 05/08/2023   CL 104 05/08/2023   CREATININE 0.98 05/08/2023   BUN 23 05/08/2023   CO2 26 05/08/2023   TSH 0.87 11/02/2022   PSA 0.54 11/02/2022   HGBA1C 5.2 11/02/2022   MICROALBUR 0.5 11/02/2022      Assessment & Plan:  Physical exam, annual Assessment & Plan: No complaints or concerns Labs drawn today Will adjust treatment depending on results  Body mass index is 30.54 kg/m.   These are the goals we discussed:  Goals      DIET - EAT MORE FRUITS AND VEGETABLES     7+ servings (1/2 cup) daily, limit red meat intake to 6 oz/week     LDL CALC < 100     Weight (lb) < 215 lb (97.5 kg)         This is a list of the screening recommended for you and due dates:  Health Maintenance  Topic Date Due   DTaP/Tdap/Td vaccine (1 - Tdap) Never done   COVID-19 Vaccine (1) 10/19/2023*   Zoster (Shingles) Vaccine (1 of 2) 01/02/2024*   Flu Shot  01/25/2024   Colon Cancer Screening  07/04/2032   HIV Screening  Completed   HPV Vaccine  Aged Out   Hepatitis C Screening  Discontinued  *Topic was postponed. The date shown is not the original due date.     No orders of the defined types were placed in this encounter.    Follow-up: Return in about 3 months (around 01/02/2024) for Chronic, Huston Foley.  An After Visit Summary was printed and given to the patient.  Langley Gauss, [place] [facility] [phone]

## 2023-10-04 LAB — COMPREHENSIVE METABOLIC PANEL WITH GFR
ALT: 29 IU/L (ref 0–44)
AST: 21 IU/L (ref 0–40)
Albumin: 4.4 g/dL (ref 3.8–4.9)
Alkaline Phosphatase: 53 IU/L (ref 44–121)
BUN/Creatinine Ratio: 20 (ref 9–20)
BUN: 24 mg/dL (ref 6–24)
Bilirubin Total: 1 mg/dL (ref 0.0–1.2)
CO2: 22 mmol/L (ref 20–29)
Calcium: 9.9 mg/dL (ref 8.7–10.2)
Chloride: 100 mmol/L (ref 96–106)
Creatinine, Ser: 1.22 mg/dL (ref 0.76–1.27)
Globulin, Total: 2.4 g/dL (ref 1.5–4.5)
Glucose: 82 mg/dL (ref 70–99)
Potassium: 4.4 mmol/L (ref 3.5–5.2)
Sodium: 139 mmol/L (ref 134–144)
Total Protein: 6.8 g/dL (ref 6.0–8.5)
eGFR: 71 mL/min/{1.73_m2} (ref >59)

## 2023-10-04 LAB — CBC WITH DIFFERENTIAL/PLATELET
Basophils Absolute: 0.1 10*3/uL (ref 0.0–0.2)
Basos: 1 %
EOS (ABSOLUTE): 0.1 10*3/uL (ref 0.0–0.4)
Eos: 1 %
Hematocrit: 46.8 % (ref 37.5–51.0)
Hemoglobin: 15.9 g/dL (ref 13.0–17.7)
Immature Grans (Abs): 0 10*3/uL (ref 0.0–0.1)
Immature Granulocytes: 0 %
Lymphocytes Absolute: 2.6 10*3/uL (ref 0.7–3.1)
Lymphs: 32 %
MCH: 32.3 pg (ref 26.6–33.0)
MCHC: 34 g/dL (ref 31.5–35.7)
MCV: 95 fL (ref 79–97)
Monocytes Absolute: 0.8 10*3/uL (ref 0.1–0.9)
Monocytes: 10 %
Neutrophils Absolute: 4.4 10*3/uL (ref 1.4–7.0)
Neutrophils: 56 %
Platelets: 264 10*3/uL (ref 150–450)
RBC: 4.92 x10E6/uL (ref 4.14–5.80)
RDW: 12 % (ref 11.6–15.4)
WBC: 8 10*3/uL (ref 3.4–10.8)

## 2023-10-04 LAB — TSH: TSH: 0.91 u[IU]/mL (ref 0.450–4.500)

## 2023-10-04 LAB — HEMOGLOBIN A1C
Est. average glucose Bld gHb Est-mCnc: 103 mg/dL
Hgb A1c MFr Bld: 5.2 % (ref 4.8–5.6)

## 2023-10-04 LAB — LIPID PANEL
Chol/HDL Ratio: 4.2 ratio (ref 0.0–5.0)
Cholesterol, Total: 202 mg/dL — ABNORMAL HIGH (ref 100–199)
HDL: 48 mg/dL (ref >39)
LDL Chol Calc (NIH): 138 mg/dL — ABNORMAL HIGH (ref 0–99)
Triglycerides: 88 mg/dL (ref 0–149)
VLDL Cholesterol Cal: 16 mg/dL (ref 5–40)

## 2023-10-04 LAB — T4, FREE: Free T4: 1.34 ng/dL (ref 0.82–1.77)

## 2023-10-04 LAB — PSA: Prostate Specific Ag, Serum: 0.9 ng/mL (ref 0.0–4.0)

## 2023-10-07 ENCOUNTER — Encounter: Payer: Self-pay | Admitting: Physician Assistant

## 2023-11-02 ENCOUNTER — Encounter: Payer: 59 | Admitting: Nurse Practitioner

## 2024-01-03 ENCOUNTER — Ambulatory Visit (INDEPENDENT_AMBULATORY_CARE_PROVIDER_SITE_OTHER): Admitting: Physician Assistant

## 2024-01-03 ENCOUNTER — Encounter: Payer: Self-pay | Admitting: Physician Assistant

## 2024-01-03 VITALS — BP 100/60 | HR 70 | Temp 97.5°F | Resp 16 | Ht 71.0 in | Wt 202.0 lb

## 2024-01-03 DIAGNOSIS — E66811 Obesity, class 1: Secondary | ICD-10-CM

## 2024-01-03 DIAGNOSIS — E7841 Elevated Lipoprotein(a): Secondary | ICD-10-CM | POA: Diagnosis not present

## 2024-01-03 DIAGNOSIS — Z23 Encounter for immunization: Secondary | ICD-10-CM

## 2024-01-03 NOTE — Assessment & Plan Note (Signed)
 Continue to monitor diet and exercise 50lb weight loss since last fall Continue with current management Labs drawn today Will adjust treatment based on results

## 2024-01-03 NOTE — Addendum Note (Signed)
 Addended by: Teighlor Korson M on: 01/03/2024 09:31 AM   Modules accepted: Orders

## 2024-01-03 NOTE — Patient Instructions (Signed)
 VISIT SUMMARY:  You came in today for a follow-up visit to check your cholesterol levels. You have no new complaints or questions since your last visit. Your cholesterol levels were previously noted to be slightly elevated, and you are here to assess your current levels. You are trying to eat less red meat and are not taking any medications for cholesterol, only vitamins. 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 You reported no stomach pain, abnormal bowel movements, or difficulty urinating.  YOUR PLAN:  -HYPERLIPIDEMIA: Hyperlipidemia means you have slightly elevated cholesterol levels. We will continue to monitor your cholesterol levels and you should keep up with your dietary modifications, such as eating less red meat. If your LDL cholesterol exceeds 150 mg/dL, we may consider starting you on statin therapy.  -GENERAL HEALTH MAINTENANCE: You are due for a Tdap booster, which is important for protection against tetanus, diphtheria, and pertussis. We will administer the Tdap vaccine today.  INSTRUCTIONS:  Please schedule a follow-up appointment in six months to monitor your health status and cholesterol levels.

## 2024-01-03 NOTE — Assessment & Plan Note (Signed)
 Slightly elevated cholesterol with LDL monitoring threshold at 150 mg/dL. Prefers non-pharmacological management. - Monitor cholesterol levels. - Continue dietary modifications. - Consider statin therapy if LDL exceeds 150 mg/dL.

## 2024-01-03 NOTE — Progress Notes (Signed)
 Subjective:  Patient ID: Timothy Newman, male    DOB: April 27, 1971  Age: 53 y.o. MRN: [id number]  Chief Complaint  Patient presents with   Medical Management of Chronic Issues    HPI:     10/03/2023    3:09 PM 11/01/2021   10:31 AM 10/28/2020    3:24 PM 10/27/2019    3:18 PM 05/14/2018    3:06 PM  Depression screen PHQ 2/9  Decreased Interest 0 0 0 0 0  Down, Depressed, Hopeless 0 0 0 0 0  PHQ - 2 Score 0 0 0 0 0        10/03/2023    3:08 PM  Fall Risk   Falls in the past year? 0  Number falls in past yr: 0  Injury with Fall? 0  Risk for fall due to : No Fall Risks  Follow up Falls evaluation completed    Patient Care Team: Milon Cleaves, [place] as PCP - General (Physician Assistant)   Review of Systems  Constitutional:  Negative for chills, fatigue, fever and unexpected weight change.  HENT:  Negative for congestion, ear pain, sinus pain and sore throat.   Respiratory:  Negative for cough and shortness of breath.   Cardiovascular:  Negative for chest pain and palpitations.  Gastrointestinal:  Negative for abdominal pain, blood in stool, constipation, diarrhea, nausea and vomiting.  Endocrine: Negative for polydipsia.  Genitourinary:  Negative for dysuria.  Musculoskeletal:  Negative for back pain.  Skin:  Negative for rash.  Neurological:  Negative for headaches.    Current Outpatient Medications on File Prior to Visit  Medication Sig Dispense Refill   Multiple Vitamins-Minerals (AIRBORNE PO) Take by mouth.     VITAMIN D  PO Take by mouth.     No current facility-administered medications on file prior to visit.   Past Medical History:  Diagnosis Date   Arthritis    Family history of abdominal aortic aneurysm (AAA) 05/16/2019   History of kidney stones    Inflamed sebaceous cyst s/p excision Nov 2014 05/12/2013   MRSA cellulitis ?2004   I&D in ED.  IV Vanco w PICC   Primary osteoarthritis of left hip 11/03/2020   Umbilical hernia - 5mm 05/12/2013   Past Surgical  History:  Procedure Laterality Date   INCISION AND DRAINAGE ABSCESS / HEMATOMA OF BURSA / KNEE / THIGH Right ?2004   ?Dr MICAEL Sink    TOTAL HIP ARTHROPLASTY Left 09/23/2021   Procedure: Left TOTAL HIP ARTHROPLASTY ANTERIOR APPROACH;  Surgeon: Vernetta Lonni GRADE, MD;  Location: [facility];  Service: Orthopedics;  Laterality: Left;   WISDOM TOOTH EXTRACTION  1985    Family History  Problem Relation Age of Onset   Asthma Mother    Fibromyalgia Mother    Hyperlipidemia Mother    Melanoma Father        with brain mets   AAA (abdominal aortic aneurysm) Maternal Grandmother    Lung cancer Maternal Grandfather        worked in Dance movement psychotherapist   Cancer Paternal Grandmother    Heart failure Paternal Grandfather 48   Social History   Socioeconomic History   Marital status: Married    Spouse name: Not on file   Number of children: 2   Years of education: Not on file   Highest education level: Not on file  Occupational History   Not on file  Tobacco Use   Smoking status: Former    Current packs/day: 0.00  Average packs/day: 1 pack/day for 3.0 years (3.0 ttl pk-yrs)    Types: Cigarettes    Start date: 05/14/1989    Quit date: 05/14/1992    Years since quitting: 31.6   Smokeless tobacco: Former    Types: Snuff    Quit date: 05/15/2015  Vaping Use   Vaping status: Never Used  Substance and Sexual Activity   Alcohol use: No   Drug use: No   Sexual activity: Yes    Partners: Female    Birth control/protection: Surgical    Comment: wife s/p hysterectomy   Other Topics Concern   Not on file  Social History Narrative   Not on file   Social Drivers of Health   Financial Resource Strain: Not on file  Food Insecurity: Not on file  Transportation Needs: Not on file  Physical Activity: Insufficiently Active (05/14/2018)   Exercise Vital Sign    Days of Exercise per Week: 2 days    Minutes of Exercise per Session: 60 min  Stress: No Stress Concern Present (05/14/2018)    [employer] of Occupational Health - Occupational Stress Questionnaire    Feeling of Stress : Only a little  Social Connections: Not on file    Objective:  BP 100/60   Pulse 70   Temp (!) 97.5 F (36.4 C)   Resp 16   Ht 5' 11 (1.803 m)   Wt 202 lb (91.6 kg)   SpO2 99%   BMI 28.17 kg/m      01/03/2024    8:02 AM 10/03/2023    3:02 PM 05/08/2023    9:25 AM  BP/Weight  Systolic BP 100 112 110  Diastolic BP 60 62 78  Wt. (Lbs) 202 219 243.8  BMI 28.17 kg/m2 30.54 kg/m2 33.07 kg/m2    Physical Exam Vitals reviewed.  Constitutional:      Appearance: Normal appearance.  Cardiovascular:     Rate and Rhythm: Normal rate and regular rhythm.     Heart sounds: Normal heart sounds.  Pulmonary:     Effort: Pulmonary effort is normal.     Breath sounds: Normal breath sounds.  Abdominal:     General: Bowel sounds are normal.     Palpations: Abdomen is soft.     Tenderness: There is no abdominal tenderness.  Neurological:     Mental Status: He is alert and oriented to person, place, and time.  Psychiatric:        Mood and Affect: Mood normal.        Behavior: Behavior normal.         Lab Results  Component Value Date   WBC 8.0 10/03/2023   HGB 15.9 10/03/2023   HCT 46.8 10/03/2023   PLT 264 10/03/2023   GLUCOSE 82 10/03/2023   CHOL 202 (H) 10/03/2023   TRIG 88 10/03/2023   HDL 48 10/03/2023   LDLCALC 138 (H) 10/03/2023   ALT 29 10/03/2023   AST 21 10/03/2023   NA 139 10/03/2023   K 4.4 10/03/2023   CL 100 10/03/2023   CREATININE 1.22 10/03/2023   BUN 24 10/03/2023   CO2 22 10/03/2023   TSH 0.910 10/03/2023   PSA 0.54 11/02/2022   HGBA1C 5.2 10/03/2023   MICROALBUR 0.5 11/02/2022      Assessment & Plan:  Elevated lipoprotein(a) Assessment & Plan: Slightly elevated cholesterol with LDL monitoring threshold at 150 mg/dL. Prefers non-pharmacological management. - Monitor cholesterol levels. - Continue dietary modifications. - Consider statin  therapy if LDL exceeds  150 mg/dL.  Orders: -     Lipid panel  Obesity (BMI 30.0-34.9) Assessment & Plan: Continue to monitor diet and exercise 50lb weight loss since last fall Continue with current management Labs drawn today Will adjust treatment based on results  Orders: -     Comprehensive metabolic panel with GFR -     CBC with Differential/Platelet    General Health Maintenance Due for Tdap booster. Discussed importance for protection against tetanus, diphtheria, and pertussis. - Administer Tdap vaccine.  Follow-up Routine follow-up planned to monitor health status and cholesterol levels. - Schedule follow-up appointment in six months.         No orders of the defined types were placed in this encounter.   Orders Placed This Encounter  Procedures   Comprehensive metabolic panel with GFR   CBC with Differential/Platelet   Lipid panel     Follow-up: Return in about 6 months (around 07/05/2024) for Chronic, Nola.   I,Marla I Leal-Borjas,acting as a scribe for [employer], [employer].,have documented all relevant documentation on the behalf of Nola Angles, PA,as directed by  Nola Angles, PA while in the presence of Nola Angles, [place].   An After Visit Summary was printed and given to the patient.  Nola Angles, [place] [facility] [phone]

## 2024-01-04 LAB — COMPREHENSIVE METABOLIC PANEL WITH GFR
ALT: 18 IU/L (ref 0–44)
AST: 15 IU/L (ref 0–40)
Albumin: 4.5 g/dL (ref 3.8–4.9)
Alkaline Phosphatase: 55 IU/L (ref 44–121)
BUN/Creatinine Ratio: 28 — ABNORMAL HIGH (ref 9–20)
BUN: 30 mg/dL — ABNORMAL HIGH (ref 6–24)
Bilirubin Total: 0.9 mg/dL (ref 0.0–1.2)
CO2: 22 mmol/L (ref 20–29)
Calcium: 9.7 mg/dL (ref 8.7–10.2)
Chloride: 102 mmol/L (ref 96–106)
Creatinine, Ser: 1.08 mg/dL (ref 0.76–1.27)
Globulin, Total: 2.4 g/dL (ref 1.5–4.5)
Glucose: 86 mg/dL (ref 70–99)
Potassium: 4.7 mmol/L (ref 3.5–5.2)
Sodium: 140 mmol/L (ref 134–144)
Total Protein: 6.9 g/dL (ref 6.0–8.5)
eGFR: 82 mL/min/1.73 (ref >59)

## 2024-01-04 LAB — CBC WITH DIFFERENTIAL/PLATELET
Basophils Absolute: 0.1 x10E3/uL (ref 0.0–0.2)
Basos: 1 %
EOS (ABSOLUTE): 0.2 x10E3/uL (ref 0.0–0.4)
Eos: 3 %
Hematocrit: 49.5 % (ref 37.5–51.0)
Hemoglobin: 16.8 g/dL (ref 13.0–17.7)
Immature Grans (Abs): 0 x10E3/uL (ref 0.0–0.1)
Immature Granulocytes: 0 %
Lymphocytes Absolute: 2.2 x10E3/uL (ref 0.7–3.1)
Lymphs: 34 %
MCH: 33.5 pg — ABNORMAL HIGH (ref 26.6–33.0)
MCHC: 33.9 g/dL (ref 31.5–35.7)
MCV: 99 fL — ABNORMAL HIGH (ref 79–97)
Monocytes Absolute: 0.8 x10E3/uL (ref 0.1–0.9)
Monocytes: 12 %
Neutrophils Absolute: 3.3 x10E3/uL (ref 1.4–7.0)
Neutrophils: 50 %
Platelets: 237 x10E3/uL (ref 150–450)
RBC: 5.01 x10E6/uL (ref 4.14–5.80)
RDW: 11.9 % (ref 11.6–15.4)
WBC: 6.6 x10E3/uL (ref 3.4–10.8)

## 2024-01-04 LAB — LIPID PANEL
Chol/HDL Ratio: 4 ratio (ref 0.0–5.0)
Cholesterol, Total: 185 mg/dL (ref 100–199)
HDL: 46 mg/dL (ref >39)
LDL Chol Calc (NIH): 125 mg/dL — ABNORMAL HIGH (ref 0–99)
Triglycerides: 73 mg/dL (ref 0–149)
VLDL Cholesterol Cal: 14 mg/dL (ref 5–40)

## 2024-01-07 ENCOUNTER — Ambulatory Visit: Payer: Self-pay | Admitting: Physician Assistant

## 2024-01-29 ENCOUNTER — Encounter: Payer: Self-pay | Admitting: Physician Assistant

## 2024-01-29 ENCOUNTER — Ambulatory Visit (INDEPENDENT_AMBULATORY_CARE_PROVIDER_SITE_OTHER): Admitting: Physician Assistant

## 2024-01-29 VITALS — BP 118/78 | HR 64 | Temp 97.8°F | Ht 71.0 in | Wt 205.0 lb

## 2024-01-29 DIAGNOSIS — K4090 Unilateral inguinal hernia, without obstruction or gangrene, not specified as recurrent: Secondary | ICD-10-CM | POA: Diagnosis not present

## 2024-01-29 DIAGNOSIS — K429 Umbilical hernia without obstruction or gangrene: Secondary | ICD-10-CM | POA: Diagnosis not present

## 2024-01-29 DIAGNOSIS — K4040 Unilateral inguinal hernia, with gangrene, not specified as recurrent: Secondary | ICD-10-CM | POA: Diagnosis not present

## 2024-01-29 HISTORY — DX: Unilateral inguinal hernia, without obstruction or gangrene, not specified as recurrent: K40.90

## 2024-01-29 NOTE — Assessment & Plan Note (Signed)
 Umbilical hernia improved with weight loss, no acute complications. - Include umbilical hernia in referral to Dr. Lynda Rubin Raddle. for evaluation.

## 2024-01-29 NOTE — Assessment & Plan Note (Signed)
 Direct inguinal hernia Reducible direct inguinal hernia with no acute complications. Discussed risks of bowel constriction. - Refer to Dr. Lynda Rubin Raddle. for surgical evaluation. - Advise against lifting heavy objects over 30 pounds. - Recommend Miralax or probiotics to prevent constipation. - Instruct to seek emergency care if hernia bulges and does not recede, causes severe pain, changes color, or is associated with fever.

## 2024-01-29 NOTE — Patient Instructions (Signed)
 VISIT SUMMARY:  Today, you were seen for muscle and groin pain, and we discussed your known umbilical hernia. You noticed swelling in your groin area last night, which subsided on its own. We evaluated your symptoms and discussed the next steps for your care.  YOUR PLAN:  -DIRECT INGUINAL HERNIA: A direct inguinal hernia occurs when tissue pushes through a weak spot in the groin muscles. Your hernia is reducible, meaning it can be pushed back into place, and currently has no acute complications. You should avoid lifting heavy objects over 30 pounds and consider using Miralax or probiotics to prevent constipation. If the hernia bulges and does not recede, causes severe pain, changes color, or is associated with fever, seek emergency care immediately. You will be referred to Dr. Armando Ramirez Jr. for a surgical evaluation.  -UMBILICAL HERNIA: An umbilical hernia occurs when part of the intestine protrudes through an opening in the abdominal muscles near the belly button. Your umbilical hernia has improved with weight loss and currently has no acute complications. This will also be evaluated by Dr. Lynda Rubin Raddle. during your referral.  INSTRUCTIONS:  You will be referred to Dr. Lynda Rubin Raddle. for a surgical evaluation of both your direct inguinal hernia and umbilical hernia. Avoid lifting heavy objects over 30 pounds and consider using Miralax or probiotics to prevent constipation. Seek emergency care if the hernia bulges and does not recede, causes severe pain, changes color, or is associated with fever.

## 2024-01-29 NOTE — Progress Notes (Signed)
 Acute Office Visit  Subjective:    Patient ID: Timothy Newman, male    DOB: 09-08-1970, 53 y.o.   MRN: [id number]  Chief Complaint  Patient presents with   Pain in groin    HPI: Patient is in today for groin pain  Discussed the use of AI scribe software for clinical note transcription with the patient, who gave verbal consent to proceed.  History of Present Illness   Timothy Newman is a 52 year old male with a history of umbilical hernia who presents with muscle and groin pain.  He first noticed groin pain while taking a shower last night. The pain is described as a tender sensation without significant discomfort. Swelling in the groin area, about the width of his finger, was observed, which protruded slightly but subsided before he went to bed. No change in color, fever, lower abdominal pain, testicular pain, or abnormal discharge. The swelling does not change in size with bearing down and is described as 'just a little tender.'  He has a known umbilical hernia, which has improved since losing weight. The swelling in the groin area resolved while he was sitting upright on the couch, not requiring him to lie down. No abnormal bowel movements, and stools are soft, aided by meal bars containing probiotics.       Past Medical History:  Diagnosis Date   Arthritis    Family history of abdominal aortic aneurysm (AAA) 05/16/2019   History of kidney stones    Inflamed sebaceous cyst s/p excision Nov 2014 05/12/2013   MRSA cellulitis ?2004   I&D in ED.  IV Vanco w PICC   Primary osteoarthritis of left hip 11/03/2020   Umbilical hernia - 5mm 05/12/2013    Past Surgical History:  Procedure Laterality Date   INCISION AND DRAINAGE ABSCESS / HEMATOMA OF BURSA / KNEE / THIGH Right ?2004   ?Dr MICAEL Sink    TOTAL HIP ARTHROPLASTY Left 09/23/2021   Procedure: Left TOTAL HIP ARTHROPLASTY ANTERIOR APPROACH;  Surgeon: Vernetta Lonni GRADE, MD;  Location: [facility];  Service: Orthopedics;   Laterality: Left;   WISDOM TOOTH EXTRACTION  1985    Family History  Problem Relation Age of Onset   Asthma Mother    Fibromyalgia Mother    Hyperlipidemia Mother    Melanoma Father        with brain mets   AAA (abdominal aortic aneurysm) Maternal Grandmother    Lung cancer Maternal Grandfather        worked in Dance movement psychotherapist   Cancer Paternal Grandmother    Heart failure Paternal Grandfather 16    Social History   Socioeconomic History   Marital status: Married    Spouse name: Not on file   Number of children: 2   Years of education: Not on file   Highest education level: Not on file  Occupational History   Not on file  Tobacco Use   Smoking status: Former    Current packs/day: 0.00    Average packs/day: 1 pack/day for 3.0 years (3.0 ttl pk-yrs)    Types: Cigarettes    Start date: 05/14/1989    Quit date: 05/14/1992    Years since quitting: 31.7   Smokeless tobacco: Former    Types: Snuff    Quit date: 05/15/2015  Vaping Use   Vaping status: Never Used  Substance and Sexual Activity   Alcohol use: No   Drug use: No   Sexual activity: Yes    Partners:  Female    Birth control/protection: Surgical    Comment: wife s/p hysterectomy   Other Topics Concern   Not on file  Social History Narrative   Not on file   Social Drivers of Health   Financial Resource Strain: Not on file  Food Insecurity: Not on file  Transportation Needs: Not on file  Physical Activity: Insufficiently Active (05/14/2018)   Exercise Vital Sign    Days of Exercise per Week: 2 days    Minutes of Exercise per Session: 60 min  Stress: No Stress Concern Present (05/14/2018)   [employer] of Occupational Health - Occupational Stress Questionnaire    Feeling of Stress : Only a little  Social Connections: Not on file  Intimate Partner Violence: Not on file    Outpatient Medications Prior to Visit  Medication Sig Dispense Refill   Multiple Vitamins-Minerals (AIRBORNE PO) Take by  mouth.     VITAMIN D  PO Take by mouth.     No facility-administered medications prior to visit.    No Known Allergies  Review of Systems  Constitutional:  Negative for appetite change, fatigue and fever.  HENT:  Negative for congestion, ear pain, sinus pressure and sore throat.   Respiratory:  Negative for cough, chest tightness, shortness of breath and wheezing.   Cardiovascular:  Negative for chest pain and palpitations.  Gastrointestinal:  Negative for abdominal pain, constipation, diarrhea, nausea and vomiting.  Genitourinary:  Negative for dysuria and hematuria.       Groin pain/swelling  Musculoskeletal:  Negative for arthralgias, back pain, joint swelling and myalgias.  Skin:  Negative for rash.  Neurological:  Negative for dizziness, weakness and headaches.  Psychiatric/Behavioral:  Negative for dysphoric mood. The patient is not nervous/anxious.        Objective:        01/29/2024    9:47 AM 01/03/2024    8:02 AM 10/03/2023    3:02 PM  Vitals with BMI  Height 5' 11 5' 11 5' 11  Weight 205 lbs 202 lbs 219 lbs  BMI 28.6 28.19 30.56  Systolic 118 100 887  Diastolic 78 60 62  Pulse 64 70 78    Orthostatic VS for the past 72 hrs (Last 3 readings):  Patient Position BP Location  01/29/24 0947 Sitting Left Arm     Physical Exam Vitals reviewed.  Constitutional:      Appearance: Normal appearance.  Neck:     Vascular: No carotid bruit.  Cardiovascular:     Rate and Rhythm: Normal rate and regular rhythm.     Heart sounds: Normal heart sounds.  Pulmonary:     Effort: Pulmonary effort is normal.     Breath sounds: Normal breath sounds.  Abdominal:     General: Bowel sounds are normal.     Palpations: Abdomen is soft.     Tenderness: There is no abdominal tenderness.     Hernia: A hernia is present. Hernia is present in the right inguinal area.  Genitourinary:    Penis: No hypospadias, tenderness, swelling or lesions.      Testes: Normal. Cremasteric reflex  is present.       Comments: Defect palpated about 5mm Neurological:     Mental Status: He is alert and oriented to person, place, and time.  Psychiatric:        Mood and Affect: Mood normal.        Behavior: Behavior normal.     Health Maintenance Due  Topic Date Due  COVID-19 Vaccine (1) Never done   Hepatitis B Vaccines (1 of 3 - 19+ 3-dose series) Never done   Zoster Vaccines- Shingrix (1 of 2) Never done   Pneumococcal Vaccine: 50+ Years (1 of 1 - PCV) Never done   INFLUENZA VACCINE  01/25/2024       Topic Date Due   Hepatitis B Vaccines (1 of 3 - 19+ 3-dose series) Never done     Lab Results  Component Value Date   TSH 0.910 10/03/2023   Lab Results  Component Value Date   WBC 6.6 01/03/2024   HGB 16.8 01/03/2024   HCT 49.5 01/03/2024   MCV 99 (H) 01/03/2024   PLT 237 01/03/2024   Lab Results  Component Value Date   NA 140 01/03/2024   K 4.7 01/03/2024   CO2 22 01/03/2024   GLUCOSE 86 01/03/2024   BUN 30 (H) 01/03/2024   CREATININE 1.08 01/03/2024   BILITOT 0.9 01/03/2024   ALKPHOS 55 01/03/2024   AST 15 01/03/2024   ALT 18 01/03/2024   PROT 6.9 01/03/2024   ALBUMIN 4.5 01/03/2024   CALCIUM 9.7 01/03/2024   ANIONGAP 5 09/24/2021   EGFR 82 01/03/2024   Lab Results  Component Value Date   CHOL 185 01/03/2024   Lab Results  Component Value Date   HDL 46 01/03/2024   Lab Results  Component Value Date   LDLCALC 125 (H) 01/03/2024   Lab Results  Component Value Date   TRIG 73 01/03/2024   Lab Results  Component Value Date   CHOLHDL 4.0 01/03/2024   Lab Results  Component Value Date   HGBA1C 5.2 10/03/2023       Assessment & Plan:  Umbilical hernia without obstruction and without gangrene Assessment & Plan: Umbilical hernia improved with weight loss, no acute complications. - Include umbilical hernia in referral to Dr. Lynda Rubin Raddle. for evaluation.   Orders: -     Ambulatory referral to General Surgery  Inguinal hernia  of right side without obstruction or gangrene Assessment & Plan: Direct inguinal hernia Reducible direct inguinal hernia with no acute complications. Discussed risks of bowel constriction. - Refer to Dr. Lynda Rubin Raddle. for surgical evaluation. - Advise against lifting heavy objects over 30 pounds. - Recommend Miralax or probiotics to prevent constipation. - Instruct to seek emergency care if hernia bulges and does not recede, causes severe pain, changes color, or is associated with fever.      No orders of the defined types were placed in this encounter.   Orders Placed This Encounter  Procedures   Ambulatory referral to General Surgery     Follow-up: Return if symptoms worsen or fail to improve.  An After Visit Summary was printed and given to the patient.    I,Lauren M Auman,acting as a Neurosurgeon for [employer], PA.,have documented all relevant documentation on the behalf of Nola Angles, PA,as directed by  Nola Angles, PA while in the presence of Nola Angles, [place].    Nola Angles, [place] [facility] [phone]

## 2024-02-04 ENCOUNTER — Ambulatory Visit: Payer: Self-pay | Admitting: Surgery

## 2024-02-05 ENCOUNTER — Ambulatory Visit (INDEPENDENT_AMBULATORY_CARE_PROVIDER_SITE_OTHER): Admitting: Physician Assistant

## 2024-02-05 ENCOUNTER — Encounter: Payer: Self-pay | Admitting: Physician Assistant

## 2024-02-05 VITALS — BP 116/72 | HR 61 | Temp 97.8°F | Ht 71.0 in | Wt 206.0 lb

## 2024-02-05 DIAGNOSIS — J0100 Acute maxillary sinusitis, unspecified: Secondary | ICD-10-CM | POA: Diagnosis not present

## 2024-02-05 MED ORDER — AZITHROMYCIN 250 MG PO TABS: ORAL_TABLET | ORAL | 0 refills | Status: AC

## 2024-02-05 MED ORDER — PREDNISONE 20 MG PO TABS: ORAL_TABLET | ORAL | 0 refills | Status: AC

## 2024-02-05 NOTE — Assessment & Plan Note (Signed)
 Acute sinusitis with sinus pressure, drainage, and swelling, more on the right. No fever or severe infection signs. - Prescribed azithromycin  for infection. - Prescribed prednisone  for inflammation.

## 2024-02-05 NOTE — Patient Instructions (Signed)
 VISIT SUMMARY:  Today, you were seen for symptoms of a sinus infection that started two days ago, including sinus pressure, drainage, and swelling. You do not have a fever, and your sleep has not been affected. You have a history of sinus infections and have used azithromycin  in the past with good results.  YOUR PLAN:  -ACUTE SINUSITIS: Acute sinusitis is an infection of the sinuses that causes symptoms like pressure, drainage, and swelling. You have been prescribed azithromycin  to treat the infection and prednisone  to reduce inflammation.  INSTRUCTIONS:  Please take the azithromycin  and prednisone  as prescribed. If your symptoms do not improve or if they worsen, please schedule a follow-up appointment.

## 2024-02-05 NOTE — Progress Notes (Signed)
 d  Acute Office Visit  Subjective:    Patient ID: Timothy Newman, male    DOB: 09-18-1970, 53 y.o.   MRN: [id number]  Chief Complaint  Patient presents with   Sinusitis    HPI: Patient is in today for acute sinusitis  Discussed the use of AI scribe software for clinical note transcription with the patient, who gave verbal consent to proceed.  History of Present Illness   Timothy Newman is a 53 year old male who presents with symptoms of a sinus infection.  He began experiencing symptoms of a sinus infection two days ago, including bilateral sinus pressure, drainage, and swelling. No fever is present. He typically experiences sinus infections once or twice a year.  He denies ear pain but has a cough and describes 'snorting stuff up'. His sleep remains undisturbed by these symptoms.  He has a history of using azithromycin  for sinus infections, which has been effective in the past.       Past Medical History:  Diagnosis Date   Arthritis    Family history of abdominal aortic aneurysm (AAA) 05/16/2019   History of kidney stones    Inflamed sebaceous cyst s/p excision Nov 2014 05/12/2013   MRSA cellulitis ?2004   I&D in ED.  IV Vanco w PICC   Primary osteoarthritis of left hip 11/03/2020   Umbilical hernia - 5mm 05/12/2013    Past Surgical History:  Procedure Laterality Date   INCISION AND DRAINAGE ABSCESS / HEMATOMA OF BURSA / KNEE / THIGH Right ?2004   ?Dr MICAEL Sink    TOTAL HIP ARTHROPLASTY Left 09/23/2021   Procedure: Left TOTAL HIP ARTHROPLASTY ANTERIOR APPROACH;  Surgeon: Vernetta Lonni GRADE, MD;  Location: [facility];  Service: Orthopedics;  Laterality: Left;   WISDOM TOOTH EXTRACTION  1985    Family History  Problem Relation Age of Onset   Asthma Mother    Fibromyalgia Mother    Hyperlipidemia Mother    Melanoma Father        with brain mets   AAA (abdominal aortic aneurysm) Maternal Grandmother    Lung cancer Maternal Grandfather        worked in Doctor, general practice   Cancer Paternal Grandmother    Heart failure Paternal Grandfather 34    Social History   Socioeconomic History   Marital status: Married    Spouse name: Not on file   Number of children: 2   Years of education: Not on file   Highest education level: Not on file  Occupational History   Not on file  Tobacco Use   Smoking status: Former    Current packs/day: 0.00    Average packs/day: 1 pack/day for 3.0 years (3.0 ttl pk-yrs)    Types: Cigarettes    Start date: 05/14/1989    Quit date: 05/14/1992    Years since quitting: 31.7   Smokeless tobacco: Former    Types: Snuff    Quit date: 05/15/2015  Vaping Use   Vaping status: Never Used  Substance and Sexual Activity   Alcohol use: No   Drug use: No   Sexual activity: Yes    Partners: Female    Birth control/protection: Surgical    Comment: wife s/p hysterectomy   Other Topics Concern   Not on file  Social History Narrative   Not on file   Social Drivers of Health   Financial Resource Strain: Not on file  Food Insecurity: Not on file  Transportation Needs: Not on file  Physical Activity: Insufficiently Active (05/14/2018)   Exercise Vital Sign    Days of Exercise per Week: 2 days    Minutes of Exercise per Session: 60 min  Stress: No Stress Concern Present (05/14/2018)   [employer] of Occupational Health - Occupational Stress Questionnaire    Feeling of Stress : Only a little  Social Connections: Not on file  Intimate Partner Violence: Not on file    Outpatient Medications Prior to Visit  Medication Sig Dispense Refill   Multiple Vitamins-Minerals (AIRBORNE PO) Take by mouth.     VITAMIN D  PO Take by mouth.     No facility-administered medications prior to visit.    No Known Allergies  Review of Systems  Constitutional:  Negative for appetite change, fatigue and fever.  HENT:  Positive for congestion, sinus pressure and sinus pain. Negative for ear pain and sore throat.        Swollen  lymph nodes  Respiratory:  Negative for cough, chest tightness, shortness of breath and wheezing.   Cardiovascular:  Negative for chest pain and palpitations.  Gastrointestinal:  Negative for abdominal pain, constipation, diarrhea, nausea and vomiting.  Genitourinary:  Negative for dysuria and hematuria.  Musculoskeletal:  Negative for arthralgias, back pain, joint swelling and myalgias.  Skin:  Negative for rash.  Neurological:  Negative for dizziness, weakness and headaches.  Psychiatric/Behavioral:  Negative for dysphoric mood. The patient is not nervous/anxious.        Objective:        02/05/2024    9:48 AM 01/29/2024    9:47 AM 01/03/2024    8:02 AM  Vitals with BMI  Height 5' 11 5' 11 5' 11  Weight 206 lbs 205 lbs 202 lbs  BMI 28.74 28.6 28.19  Systolic 116 118 899  Diastolic 72 78 60  Pulse 61 64 70    Orthostatic VS for the past 72 hrs (Last 3 readings):  Patient Position BP Location  02/05/24 0948 Sitting Left Arm     Physical Exam Vitals reviewed.  Constitutional:      Appearance: Normal appearance.  HENT:     Right Ear: Hearing and ear canal normal. Swelling present. A middle ear effusion is present. Tympanic membrane is bulging.     Left Ear: Hearing and ear canal normal. Swelling present. A middle ear effusion is present. Tympanic membrane is bulging.  Neck:     Vascular: No carotid bruit.  Cardiovascular:     Rate and Rhythm: Normal rate and regular rhythm.     Heart sounds: Normal heart sounds.  Pulmonary:     Effort: Pulmonary effort is normal.     Breath sounds: Normal breath sounds.  Abdominal:     General: Bowel sounds are normal.     Palpations: Abdomen is soft.     Tenderness: There is no abdominal tenderness.  Neurological:     Mental Status: He is alert and oriented to person, place, and time.  Psychiatric:        Mood and Affect: Mood normal.        Behavior: Behavior normal.     Health Maintenance Due  Topic Date Due   COVID-19  Vaccine (1) Never done   Hepatitis B Vaccines (1 of 3 - 19+ 3-dose series) Never done   Zoster Vaccines- Shingrix (1 of 2) Never done   Pneumococcal Vaccine: 50+ Years (1 of 1 - PCV) Never done   INFLUENZA VACCINE  01/25/2024       Topic  Date Due   Hepatitis B Vaccines (1 of 3 - 19+ 3-dose series) Never done     Lab Results  Component Value Date   TSH 0.910 10/03/2023   Lab Results  Component Value Date   WBC 6.6 01/03/2024   HGB 16.8 01/03/2024   HCT 49.5 01/03/2024   MCV 99 (H) 01/03/2024   PLT 237 01/03/2024   Lab Results  Component Value Date   NA 140 01/03/2024   K 4.7 01/03/2024   CO2 22 01/03/2024   GLUCOSE 86 01/03/2024   BUN 30 (H) 01/03/2024   CREATININE 1.08 01/03/2024   BILITOT 0.9 01/03/2024   ALKPHOS 55 01/03/2024   AST 15 01/03/2024   ALT 18 01/03/2024   PROT 6.9 01/03/2024   ALBUMIN 4.5 01/03/2024   CALCIUM 9.7 01/03/2024   ANIONGAP 5 09/24/2021   EGFR 82 01/03/2024   Lab Results  Component Value Date   CHOL 185 01/03/2024   Lab Results  Component Value Date   HDL 46 01/03/2024   Lab Results  Component Value Date   LDLCALC 125 (H) 01/03/2024   Lab Results  Component Value Date   TRIG 73 01/03/2024   Lab Results  Component Value Date   CHOLHDL 4.0 01/03/2024   Lab Results  Component Value Date   HGBA1C 5.2 10/03/2023       Assessment & Plan:  Acute non-recurrent maxillary sinusitis Assessment & Plan: Acute sinusitis with sinus pressure, drainage, and swelling, more on the right. No fever or severe infection signs. - Prescribed azithromycin  for infection. - Prescribed prednisone  for inflammation.   Orders: -     Azithromycin ; Take 2 tablets on day 1, then 1 tablet daily on days 2 through 5  Dispense: 6 tablet; Refill: 0 -     predniSONE ; Take 3 tablets (60 mg total) by mouth daily with breakfast for 3 days, THEN 2 tablets (40 mg total) daily with breakfast for 3 days, THEN 1 tablet (20 mg total) daily with breakfast for 3  days.  Dispense: 18 tablet; Refill: 0       Meds ordered this encounter  Medications   azithromycin  (ZITHROMAX ) 250 MG tablet    Sig: Take 2 tablets on day 1, then 1 tablet daily on days 2 through 5    Dispense:  6 tablet    Refill:  0   predniSONE  (DELTASONE ) 20 MG tablet    Sig: Take 3 tablets (60 mg total) by mouth daily with breakfast for 3 days, THEN 2 tablets (40 mg total) daily with breakfast for 3 days, THEN 1 tablet (20 mg total) daily with breakfast for 3 days.    Dispense:  18 tablet    Refill:  0    No orders of the defined types were placed in this encounter.    Follow-up: Return if symptoms worsen or fail to improve, for [facility].  An After Visit Summary was printed and given to the patient.    I,Lauren M Auman,acting as a Neurosurgeon for [employer], [employer].,have documented all relevant documentation on the behalf of Nola Angles, PA,as directed by  Nola Angles, PA while in the presence of Nola Angles, [place].   Nola Angles, [place] [facility] [phone]

## 2024-02-13 IMAGING — DX DG PORTABLE PELVIS
1 series · 1 of 1 positions shown · non-contrast
Comparison: Intraoperative fluoroscopic images of the left hip
09/23/2021. Radiographs of the left hip 03/14/2021.

CLINICAL DATA: Postop left hip replacement.

EXAM:
PORTABLE PELVIS 1-2 VIEWS

[pelvis ap]
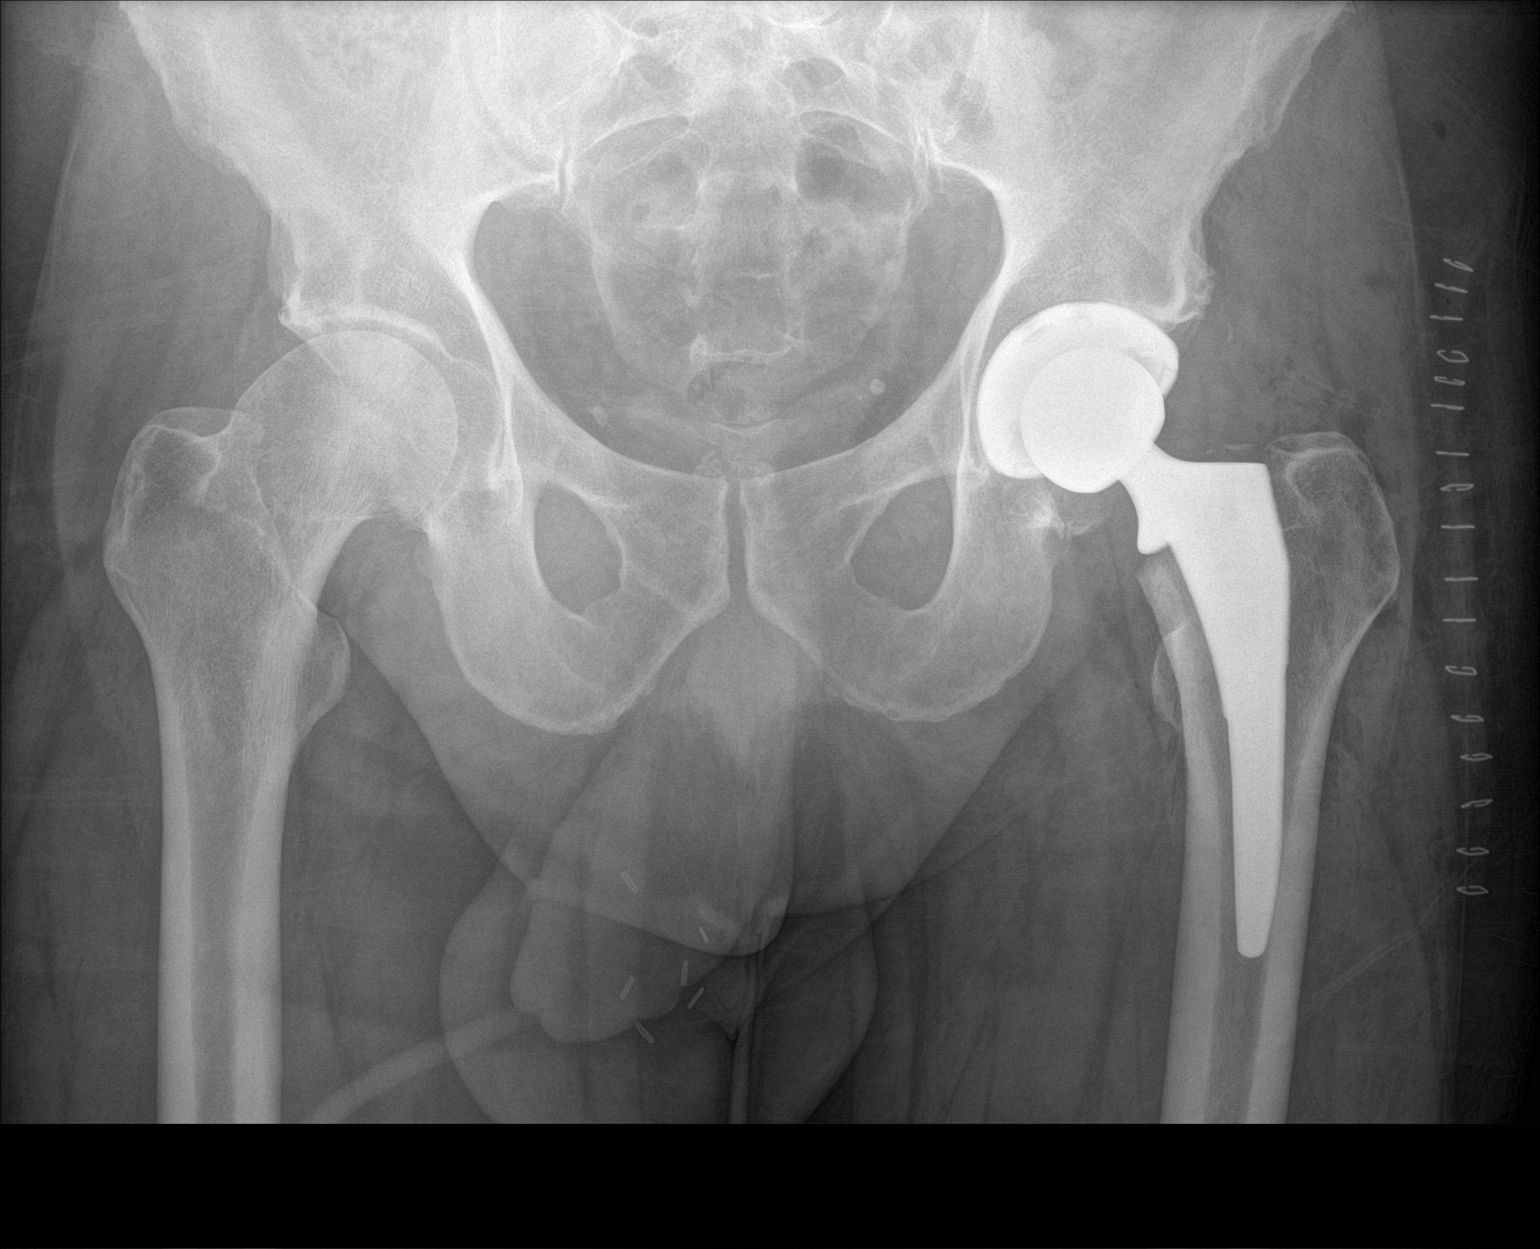

[1 of 1 positions shown; findings below may reference images not displayed]

FINDINGS: Immediate postoperative changes from left total hip replacement. The
femoral and acetabular components appear well-seated. Edema and gas
within the surrounding soft tissues. Overlying skin staples.
IMPRESSION: Immediate postoperative changes from left total hip arthroplasty, as
described.

## 2024-02-19 ENCOUNTER — Encounter (HOSPITAL_BASED_OUTPATIENT_CLINIC_OR_DEPARTMENT_OTHER): Payer: Self-pay | Admitting: Surgery

## 2024-02-26 NOTE — H&P (Signed)
 History of Present Illness: Timothy Newman MR is a 53 y.o. male who is seen today as an office consultation for evaluation of New Consultation ( UMBILICAL HERNIA )  Patient presents for evaluation of symptomatic right inguinal hernia. He noticed a bulge in the shower 1 day the last week or 2. He does poke out and cause some aching especially at work. He gets better when he is recumbent or in his truck. No nausea vomiting or change in bowel or bladder function noted.  Review of Systems: A complete review of systems was obtained from the patient. I have reviewed this information and discussed as appropriate with the patient. See HPI as well for other ROS.    Medical History: History reviewed. No pertinent past medical history.  There is no problem list on file for this patient.  Past Surgical History:  Procedure Laterality Date  JOINT REPLACEMENT    No Known Allergies  No current outpatient medications on file prior to visit.   No current facility-administered medications on file prior to visit.   No family history on file.   Social History   Tobacco Use  Smoking Status Former  Types: Cigarettes  Smokeless Tobacco Never    Social History   Socioeconomic History  Marital status: Married  Tobacco Use  Smoking status: Former  Types: Cigarettes  Smokeless tobacco: Never  Substance and Sexual Activity  Drug use: Never   Social Drivers of Health   Physical Activity: Insufficiently Active (05/14/2018)  [facility]  Exercise Vital Sign  Days of Exercise per Week: 2 days  Minutes of Exercise per Session: 60 min  Stress: No Stress Concern Present (05/14/2018)  Received from [facility] of Occupational Health - Occupational Stress Questionnaire  Feeling of Stress : Only a little  Housing Stability: Unknown (02/04/2024)  Housing Stability Vital Sign  Homeless in the Last Year: No   Objective:   Vitals:  02/04/24 0943  BP: 108/72   Pulse: 62  Temp: 36.7 C (98 F)  SpO2: 99%  Weight: 91.6 kg (202 lb)  Height: 182.9 cm (6')   Body mass index is 27.4 kg/m.  Physical Exam Exam conducted with a chaperone present.  HENT:  Head: Normocephalic.  Cardiovascular:  Rate and Rhythm: Normal rate.  Pulmonary:  Effort: Pulmonary effort is normal.  Abdominal:  Tenderness: There is no abdominal tenderness.  Hernia: A hernia is present. Hernia is present in the umbilical area and right inguinal area. There is no hernia in the left inguinal area.   Comments: Tiny 5 mm umbilical hernia noted. Soft nontender reducible.  Musculoskeletal:  General: Normal range of motion.  Skin: General: Skin is warm.  Neurological:  General: No focal deficit present.  Mental Status: He is alert.     Assessment and Plan:   Diagnoses and all orders for this visit:  Right inguinal hernia   Discussed repair versus observation of his right inguinal hernia. Discussed the use of mesh. Discussed different techniques of repair. His umbilical hernia is quite small and he has no interest in repairing this at this time.  The risk of hernia repair include bleeding, Infection, Recurrence of the hernia, Mesh use, chronic pain, Organ injury, Bowel injury, Bladder injury, nerve injury with numbness around the incision, Death, and worsening of preexisting medical problems. The alternatives to surgery have been discussed as well.. Long term expectations of both operative and non operative treatments have been discussed. The patient agrees to proceed.  DEBBY CURTISTINE SHIPPER, MD

## 2024-02-26 NOTE — Anesthesia Preprocedure Evaluation (Signed)
 Anesthesia Evaluation  Patient identified by MRN, date of birth, ID band Patient awake    Reviewed: Allergy & Precautions, H&P , NPO status , Patient's Chart, lab work & pertinent test results  Airway Mallampati: II  TM Distance: >3 FB Neck ROM: Full    Dental no notable dental hx. (+) Teeth Intact, Dental Advisory Given   Pulmonary neg pulmonary ROS, former smoker   Pulmonary exam normal breath sounds clear to auscultation       Cardiovascular Exercise Tolerance: Good negative cardio ROS  Rhythm:Regular Rate:Normal     Neuro/Psych negative neurological ROS  negative psych ROS   GI/Hepatic negative GI ROS, Neg liver ROS,,,  Endo/Other  negative endocrine ROS    Renal/GU negative Renal ROS  negative genitourinary   Musculoskeletal  (+) Arthritis , Osteoarthritis,    Abdominal   Peds  Hematology negative hematology ROS (+)   Anesthesia Other Findings   Reproductive/Obstetrics negative OB ROS                              Anesthesia Physical Anesthesia Plan  ASA: 2  Anesthesia Plan: General   Post-op Pain Management: Regional block*, Tylenol  PO (pre-op)* and Toradol  IV (intra-op)*   Induction: Intravenous  PONV Risk Score and Plan: 3 and Ondansetron , Dexamethasone  and Midazolam   Airway Management Planned: LMA and Oral ETT  Additional Equipment:   Intra-op Plan:   Post-operative Plan: Extubation in OR  Informed Consent: I have reviewed the patients History and Physical, chart, labs and discussed the procedure including the risks, benefits and alternatives for the proposed anesthesia with the patient or authorized representative who has indicated his/her understanding and acceptance.     Dental advisory given  Plan Discussed with: CRNA  Anesthesia Plan Comments:          Anesthesia Quick Evaluation

## 2024-02-27 ENCOUNTER — Encounter (HOSPITAL_BASED_OUTPATIENT_CLINIC_OR_DEPARTMENT_OTHER): Payer: Self-pay | Admitting: Surgery

## 2024-02-27 ENCOUNTER — Ambulatory Visit (HOSPITAL_BASED_OUTPATIENT_CLINIC_OR_DEPARTMENT_OTHER): Payer: Self-pay | Admitting: Anesthesiology

## 2024-02-27 ENCOUNTER — Encounter (HOSPITAL_BASED_OUTPATIENT_CLINIC_OR_DEPARTMENT_OTHER)
Admission: RE | Disposition: A | Discharge status: Home / Self Care | Payer: Self-pay | Source: Home / Self Care | Attending: Surgery

## 2024-02-27 ENCOUNTER — Other Ambulatory Visit: Payer: Self-pay

## 2024-02-27 ENCOUNTER — Ambulatory Visit (HOSPITAL_BASED_OUTPATIENT_CLINIC_OR_DEPARTMENT_OTHER)
Admission: RE | Admit: 2024-02-27 | Discharge: 2024-02-27 | Disposition: A | Discharge status: Home / Self Care | Attending: Surgery | Admitting: Surgery

## 2024-02-27 DIAGNOSIS — K4090 Unilateral inguinal hernia, without obstruction or gangrene, not specified as recurrent: Secondary | ICD-10-CM | POA: Diagnosis present

## 2024-02-27 DIAGNOSIS — Z87891 Personal history of nicotine dependence: Secondary | ICD-10-CM | POA: Diagnosis not present

## 2024-02-27 DIAGNOSIS — K429 Umbilical hernia without obstruction or gangrene: Secondary | ICD-10-CM | POA: Diagnosis not present

## 2024-02-27 HISTORY — PX: INGUINAL HERNIA REPAIR: SHX194

## 2024-02-27 SURGERY — REPAIR, HERNIA, INGUINAL, ADULT
Anesthesia: General | Site: Groin | Laterality: Right

## 2024-02-27 MED ORDER — ONDANSETRON HCL 4 MG/2ML IJ SOLN
INTRAMUSCULAR | Status: DC | PRN
  Administered 2024-02-27: 4 mg via INTRAVENOUS

## 2024-02-27 MED ORDER — FENTANYL CITRATE (PF) 100 MCG/2ML IJ SOLN
INTRAMUSCULAR | Status: AC
Start: 2024-02-27 — End: 2024-02-27
  Filled 2024-02-27: qty 2

## 2024-02-27 MED ORDER — DEXAMETHASONE SODIUM PHOSPHATE 10 MG/ML IJ SOLN
INTRAMUSCULAR | Status: AC
  Filled 2024-02-27: qty 1

## 2024-02-27 MED ORDER — ACETAMINOPHEN 500 MG PO TABS
ORAL_TABLET | ORAL | Status: AC
  Filled 2024-02-27: qty 2

## 2024-02-27 MED ORDER — BUPIVACAINE-EPINEPHRINE 0.25% -1:200000 IJ SOLN
INTRAMUSCULAR | Status: DC | PRN
  Administered 2024-02-27: 12 mL

## 2024-02-27 MED ORDER — PHENYLEPHRINE 80 MCG/ML (10ML) SYRINGE FOR IV PUSH (FOR BLOOD PRESSURE SUPPORT)
PREFILLED_SYRINGE | INTRAVENOUS | Status: DC | PRN
  Administered 2024-02-27 (×4): 80 ug via INTRAVENOUS

## 2024-02-27 MED ORDER — OXYCODONE HCL 5 MG PO TABS
5.0000 mg | ORAL_TABLET | Freq: Once | ORAL | Status: AC
  Administered 2024-02-27: 5 mg via ORAL

## 2024-02-27 MED ORDER — LIDOCAINE 2% (20 MG/ML) 5 ML SYRINGE
INTRAMUSCULAR | Status: AC
  Filled 2024-02-27: qty 5

## 2024-02-27 MED ORDER — CHLORHEXIDINE GLUCONATE CLOTH 2 % EX PADS: 6.0000 | MEDICATED_PAD | Freq: Once | CUTANEOUS | Status: DC

## 2024-02-27 MED ORDER — BUPIVACAINE-EPINEPHRINE (PF) 0.5% -1:200000 IJ SOLN
INTRAMUSCULAR | Status: DC | PRN
  Administered 2024-02-27: 20 mL via PERINEURAL

## 2024-02-27 MED ORDER — MIDAZOLAM HCL 2 MG/2ML IJ SOLN: 1.0000 mg | Freq: Once | INTRAMUSCULAR | Status: DC

## 2024-02-27 MED ORDER — GLYCOPYRROLATE PF 0.2 MG/ML IJ SOSY
PREFILLED_SYRINGE | INTRAMUSCULAR | Status: DC | PRN
  Administered 2024-02-27: .2 mg via INTRAVENOUS

## 2024-02-27 MED ORDER — METHOCARBAMOL 500 MG PO TABS: 500.0000 mg | ORAL_TABLET | Freq: Four times a day (QID) | ORAL | 0 refills | Status: DC

## 2024-02-27 MED ORDER — OXYCODONE HCL 5 MG PO TABS: 5.0000 mg | ORAL_TABLET | Freq: Four times a day (QID) | ORAL | 0 refills | Status: DC | PRN

## 2024-02-27 MED ORDER — MIDAZOLAM HCL 2 MG/2ML IJ SOLN
INTRAMUSCULAR | Status: AC
  Filled 2024-02-27: qty 2

## 2024-02-27 MED ORDER — GLYCOPYRROLATE PF 0.2 MG/ML IJ SOSY
PREFILLED_SYRINGE | INTRAMUSCULAR | Status: AC
  Filled 2024-02-27: qty 1

## 2024-02-27 MED ORDER — ACETAMINOPHEN 500 MG PO TABS
1000.0000 mg | ORAL_TABLET | ORAL | Status: AC
  Administered 2024-02-27: 1000 mg via ORAL

## 2024-02-27 MED ORDER — ONDANSETRON HCL 4 MG/2ML IJ SOLN
INTRAMUSCULAR | Status: AC
  Filled 2024-02-27: qty 2

## 2024-02-27 MED ORDER — EPHEDRINE SULFATE-NACL 50-0.9 MG/10ML-% IV SOSY
PREFILLED_SYRINGE | INTRAVENOUS | Status: DC | PRN
  Administered 2024-02-27 (×2): 5 mg via INTRAVENOUS

## 2024-02-27 MED ORDER — BUPIVACAINE-EPINEPHRINE (PF) 0.25% -1:200000 IJ SOLN
INTRAMUSCULAR | Status: AC
  Filled 2024-02-27: qty 90

## 2024-02-27 MED ORDER — OXYCODONE HCL 5 MG PO TABS
ORAL_TABLET | ORAL | Status: AC
  Filled 2024-02-27: qty 1

## 2024-02-27 MED ORDER — CEFAZOLIN SODIUM-DEXTROSE 2-4 GM/100ML-% IV SOLN
2.0000 g | INTRAVENOUS | Status: AC
  Administered 2024-02-27: 2 g via INTRAVENOUS

## 2024-02-27 MED ORDER — HYDROMORPHONE HCL 1 MG/ML IJ SOLN
INTRAMUSCULAR | Status: AC
  Filled 2024-02-27: qty 0.5

## 2024-02-27 MED ORDER — PROPOFOL 10 MG/ML IV BOLUS
INTRAVENOUS | Status: DC | PRN
  Administered 2024-02-27: 200 mg via INTRAVENOUS

## 2024-02-27 MED ORDER — KETOROLAC TROMETHAMINE 30 MG/ML IJ SOLN
INTRAMUSCULAR | Status: AC
  Filled 2024-02-27: qty 1

## 2024-02-27 MED ORDER — LACTATED RINGERS IV SOLN: INTRAVENOUS | Status: DC | PRN

## 2024-02-27 MED ORDER — FENTANYL CITRATE (PF) 100 MCG/2ML IJ SOLN
100.0000 ug | Freq: Once | INTRAMUSCULAR | Status: AC
  Administered 2024-02-27: 100 ug via INTRAVENOUS

## 2024-02-27 MED ORDER — KETOROLAC TROMETHAMINE 30 MG/ML IJ SOLN
INTRAMUSCULAR | Status: DC | PRN
  Administered 2024-02-27: 30 mg via INTRAVENOUS

## 2024-02-27 MED ORDER — HYDROMORPHONE HCL 1 MG/ML IJ SOLN
0.2500 mg | INTRAMUSCULAR | Status: DC | PRN
  Administered 2024-02-27: 0.5 mg via INTRAVENOUS

## 2024-02-27 MED ORDER — LACTATED RINGERS IV SOLN: INTRAVENOUS | Status: DC

## 2024-02-27 MED ORDER — CEFAZOLIN SODIUM-DEXTROSE 2-4 GM/100ML-% IV SOLN
INTRAVENOUS | Status: AC
  Filled 2024-02-27: qty 100

## 2024-02-27 MED ORDER — PHENYLEPHRINE 80 MCG/ML (10ML) SYRINGE FOR IV PUSH (FOR BLOOD PRESSURE SUPPORT)
PREFILLED_SYRINGE | INTRAVENOUS | Status: AC
  Filled 2024-02-27: qty 10

## 2024-02-27 MED ORDER — LIDOCAINE 2% (20 MG/ML) 5 ML SYRINGE
INTRAMUSCULAR | Status: DC | PRN
  Administered 2024-02-27: 40 mg via INTRAVENOUS

## 2024-02-27 MED ORDER — DEXAMETHASONE SODIUM PHOSPHATE 10 MG/ML IJ SOLN
INTRAMUSCULAR | Status: DC | PRN
  Administered 2024-02-27: 10 mg via INTRAVENOUS

## 2024-02-27 MED ORDER — FENTANYL CITRATE (PF) 100 MCG/2ML IJ SOLN: 50.0000 ug | Freq: Once | INTRAMUSCULAR | Status: DC

## 2024-02-27 MED ORDER — IBUPROFEN 800 MG PO TABS: 800.0000 mg | ORAL_TABLET | Freq: Three times a day (TID) | ORAL | 0 refills | Status: AC | PRN

## 2024-02-27 MED ORDER — BUPIVACAINE LIPOSOME 1.3 % IJ SUSP
INTRAMUSCULAR | Status: DC | PRN
  Administered 2024-02-27: 10 mL via PERINEURAL

## 2024-02-27 MED ORDER — FENTANYL CITRATE (PF) 100 MCG/2ML IJ SOLN
INTRAMUSCULAR | Status: DC | PRN
  Administered 2024-02-27: 50 ug via INTRAVENOUS
  Administered 2024-02-27 (×2): 25 ug via INTRAVENOUS

## 2024-02-27 MED ORDER — MIDAZOLAM HCL 2 MG/2ML IJ SOLN
2.0000 mg | Freq: Once | INTRAMUSCULAR | Status: AC
  Administered 2024-02-27: 2 mg via INTRAVENOUS

## 2024-02-27 SURGICAL SUPPLY — 42 items
BLADE CLIPPER SURG (BLADE) IMPLANT
BLADE SURG 15 STRL LF DISP TIS (BLADE) ×1 IMPLANT
CANISTER SUCT 1200ML W/VALVE (MISCELLANEOUS) IMPLANT
CHLORAPREP W/TINT 26 (MISCELLANEOUS) ×1 IMPLANT
COVER BACK TABLE 60X90IN (DRAPES) ×1 IMPLANT
COVER MAYO STAND STRL (DRAPES) ×1 IMPLANT
DERMABOND ADVANCED .7 DNX12 (GAUZE/BANDAGES/DRESSINGS) ×1 IMPLANT
DRAIN PENROSE .5X12 LATEX STL (DRAIN) ×1 IMPLANT
DRAPE LAPAROTOMY TRNSV 102X78 (DRAPES) ×1 IMPLANT
DRAPE UTILITY XL STRL (DRAPES) ×1 IMPLANT
ELECT COATED BLADE 2.86 ST (ELECTRODE) ×1 IMPLANT
ELECTRODE REM PT RTRN 9FT ADLT (ELECTROSURGICAL) ×1 IMPLANT
GAUZE 4X4 16PLY ~~LOC~~+RFID DBL (SPONGE) IMPLANT
GAUZE SPONGE 4X4 12PLY STRL LF (GAUZE/BANDAGES/DRESSINGS) IMPLANT
GLOVE BIO SURGEON STRL SZ7 (GLOVE) IMPLANT
GLOVE BIOGEL PI IND STRL 7.0 (GLOVE) IMPLANT
GLOVE BIOGEL PI IND STRL 8 (GLOVE) ×1 IMPLANT
GLOVE ECLIPSE 8.0 STRL XLNG CF (GLOVE) ×1 IMPLANT
GOWN STRL REUS W/ TWL LRG LVL3 (GOWN DISPOSABLE) ×2 IMPLANT
GOWN STRL REUS W/ TWL XL LVL3 (GOWN DISPOSABLE) ×1 IMPLANT
HEMOSTAT SNOW SURGICEL 2X4 (HEMOSTASIS) IMPLANT
MESH OVITEX 1S PERM 6X10 6L (Mesh General) IMPLANT
NDL HYPO 25X1 1.5 SAFETY (NEEDLE) ×1 IMPLANT
NEEDLE HYPO 25X1 1.5 SAFETY (NEEDLE) ×1 IMPLANT
NS IRRIG 1000ML POUR BTL (IV SOLUTION) IMPLANT
PACK BASIN DAY SURGERY FS (CUSTOM PROCEDURE TRAY) ×1 IMPLANT
PENCIL SMOKE EVACUATOR (MISCELLANEOUS) ×1 IMPLANT
SLEEVE SCD COMPRESS KNEE MED (STOCKING) ×1 IMPLANT
SPIKE FLUID TRANSFER (MISCELLANEOUS) IMPLANT
SPONGE T-LAP 4X18 ~~LOC~~+RFID (SPONGE) ×1 IMPLANT
STRIP CLOSURE SKIN 1/2X4 (GAUZE/BANDAGES/DRESSINGS) IMPLANT
SUT MON AB 4-0 PC3 18 (SUTURE) ×1 IMPLANT
SUT NOVA 0 T19/GS 22DT (SUTURE) ×2 IMPLANT
SUT VIC AB 2-0 SH 27XBRD (SUTURE) ×1 IMPLANT
SUT VIC AB 3-0 54X BRD REEL (SUTURE) IMPLANT
SUT VICRYL 0 SH 27 (SUTURE) IMPLANT
SUT VICRYL 3-0 CR8 SH (SUTURE) ×1 IMPLANT
SUT VICRYL AB 2 0 TIE (SUTURE) IMPLANT
SYR CONTROL 10ML LL (SYRINGE) ×1 IMPLANT
TOWEL GREEN STERILE FF (TOWEL DISPOSABLE) ×1 IMPLANT
TUBE CONNECTING 20X1/4 (TUBING) IMPLANT
YANKAUER SUCT BULB TIP NO VENT (SUCTIONS) IMPLANT

## 2024-02-27 NOTE — Progress Notes (Signed)
 Assisted Dr. Marney Needle with right, transabdominal plane, ultrasound guided block. Side rails up, monitors on throughout procedure. See vital signs in flow sheet. Tolerated Procedure well.

## 2024-02-27 NOTE — Discharge Instructions (Addendum)
 [facility] _______[facility]  UMBILICAL OR INGUINAL HERNIA REPAIR: POST OP INSTRUCTIONS  Always review your discharge instruction sheet given to you by the facility where your surgery was performed. IF YOU HAVE DISABILITY OR FAMILY LEAVE FORMS, YOU MUST BRING THEM TO THE OFFICE FOR PROCESSING.   DO NOT GIVE THEM TO YOUR DOCTOR.  1. A  prescription for pain medication may be given to you upon discharge.  Take your pain medication as prescribed, if needed.  If narcotic pain medicine is not needed, then you may take acetaminophen  (Tylenol ) or ibuprofen  (Advil ) as needed. 2. Take your usually prescribed medications unless otherwise directed. If you need a refill on your pain medication, please contact your pharmacy.  They will contact our office to request authorization. Prescriptions will not be filled after 5 pm or on week-ends. 3. You should follow a light diet the first 24 hours after arrival home, such as soup and crackers, etc.  Be sure to include lots of fluids daily.  Resume your normal diet the day after surgery. 4.Most patients will experience some swelling and bruising around the umbilicus or in the groin and scrotum.  Ice packs and reclining will help.  Swelling and bruising can take several days to resolve.  6. It is common to experience some constipation if taking pain medication after surgery.  Increasing fluid intake and taking a stool softener (such as Colace) will usually help or prevent this problem from occurring.  A mild laxative (Milk of Magnesia or Miralax) should be taken according to package directions if there are no bowel movements after 48 hours. 7. Unless discharge instructions indicate otherwise, you may remove your bandages 24-48 hours after surgery, and you may shower at that time.  You may have steri-strips (small skin tapes) in place directly over the incision.  These strips should be left on the skin for 7-10 days.  If your surgeon used skin glue on the  incision, you may shower in 24 hours.  The glue will flake off over the next 2-3 weeks.  Any sutures or staples will be removed at the office during your follow-up visit. 8. ACTIVITIES:  You may resume regular (light) daily activities beginning the next day--such as daily self-care, walking, climbing stairs--gradually increasing activities as tolerated.  You may have sexual intercourse when it is comfortable.  Refrain from any heavy lifting or straining until approved by your doctor.  a.You may drive when you are no longer taking prescription pain medication, you can comfortably wear a seatbelt, and you can safely maneuver your car and apply brakes. b.RETURN TO WORK:   _____________________________________________  9.You should see your doctor in the office for a follow-up appointment approximately 2-3 weeks after your surgery.  Make sure that you call for this appointment within a day or two after you arrive home to insure a convenient appointment time. 10.OTHER INSTRUCTIONS: _________________________    _____________________________________  WHEN TO CALL YOUR DOCTOR: Fever over 101.0 Inability to urinate Nausea and/or vomiting Extreme swelling or bruising Continued bleeding from incision. Increased pain, redness, or drainage from the incision  The clinic staff is available to answer your questions during regular business hours.  Please don't hesitate to call and ask to speak to one of the nurses for clinical concerns.  If you have a medical emergency, go to the nearest emergency room or call 911.  A surgeon from [facility] is always on call at the hospital   [address],  [place], [place]  [address] ?  [address] [address], [place], [place]   [address] [phone] ? [phone] ? FAX [phone] Web site: [email or web address]    Post Anesthesia Home Care Instructions  Activity: Get plenty of rest for the remainder of the day. A responsible  individual must stay with you for 24 hours following the procedure.  For the next 24 hours, DO NOT: -Drive a car -Advertising copywriter -Drink alcoholic beverages -Take any medication unless instructed by your physician -Make any legal decisions or sign important papers.  Meals: Start with liquid foods such as gelatin or soup. Progress to regular foods as tolerated. Avoid greasy, spicy, heavy foods. If nausea and/or vomiting occur, drink only clear liquids until the nausea and/or vomiting subsides. Call your physician if vomiting continues.  Special Instructions/Symptoms: Your throat may feel dry or sore from the anesthesia or the breathing tube placed in your throat during surgery. If this causes discomfort, gargle with warm salt water . The discomfort should disappear within 24 hours.  If you had a scopolamine patch placed behind your ear for the management of post- operative nausea and/or vomiting:  1. The medication in the patch is effective for 72 hours, after which it should be removed.  Wrap patch in a tissue and discard in the trash. Wash hands thoroughly with soap and water . 2. You may remove the patch earlier than 72 hours if you experience unpleasant side effects which may include dry mouth, dizziness or visual disturbances. 3. Avoid touching the patch. Wash your hands with soap and water  after contact with the patch.     Information for Discharge Teaching: EXPAREL  (bupivacaine  liposome injectable suspension)   Pain relief is important to your recovery. The goal is to control your pain so you can move easier and return to your normal activities as soon as possible after your procedure. Your physician may use several types of medicines to manage pain, swelling, and more.  Your surgeon or anesthesiologist gave you EXPAREL (bupivacaine ) to help control your pain after surgery.  EXPAREL  is a local anesthetic designed to release slowly over an extended period of time to provide pain  relief by numbing the tissue around the surgical site. EXPAREL  is designed to release pain medication over time and can control pain for up to 72 hours. Depending on how you respond to EXPAREL , you may require less pain medication during your recovery. EXPAREL  can help reduce or eliminate the need for opioids during the first few days after surgery when pain relief is needed the most. EXPAREL  is not an opioid and is not addictive. It does not cause sleepiness or sedation.   Important! A teal colored band has been placed on your arm with the date, time and amount of EXPAREL  you have received. Please leave this armband in place for the full 96 hours following administration, and then you may remove the band. If you return to the hospital for any reason within 96 hours following the administration of EXPAREL , the armband provides important information that your health care providers to know, and alerts them that you have received this anesthetic.    Possible side effects of EXPAREL : Temporary loss of sensation or ability to move in the area where medication was injected. Nausea, vomiting, constipation Rarely, numbness and tingling in your mouth or lips, lightheadedness, or anxiety may occur. Call your doctor right away if you think you may be experiencing any of these sensations, or if you have other questions regarding possible side effects.  Follow  all other discharge instructions given to you by your surgeon or nurse. Eat a healthy diet and drink plenty of water  or other fluids.  Regional Anesthesia Blocks  1. You may not be able to move or feel the blocked extremity after a regional anesthetic block. This may last may last from 3-48 hours after placement, but it will go away. The length of time depends on the medication injected and your individual response to the medication. As the nerves start to wake up, you may experience tingling as the movement and feeling returns to your extremity. If the  numbness and inability to move your extremity has not gone away after 48 hours, please call your surgeon.   2. The extremity that is blocked will need to be protected until the numbness is gone and the strength has returned. Because you cannot feel it, you will need to take extra care to avoid injury. Because it may be weak, you may have difficulty moving it or using it. You may not know what position it is in without looking at it while the block is in effect.  3. For blocks in the legs and feet, returning to weight bearing and walking needs to be done carefully. You will need to wait until the numbness is entirely gone and the strength has returned. You should be able to move your leg and foot normally before you try and bear weight or walk. You will need someone to be with you when you first try to ensure you do not fall and possibly risk injury.  4. Bruising and tenderness at the needle site are common side effects and will resolve in a few days.  5. Persistent numbness or new problems with movement should be communicated to the surgeon or the [facility] [phone] [facility] [phone]).  Tylenol  can be taken after 1 pm if needed

## 2024-02-27 NOTE — Interval H&P Note (Signed)
 History and Physical Interval Note:  02/27/2024 8:15 AM  Timothy Newman  has presented today for surgery, with the diagnosis of RIGHT INGUINAL HERNIA.  The various methods of treatment have been discussed with the patient and family. After consideration of risks, benefits and other options for treatment, the patient has consented to  Procedure(s) with comments: REPAIR, HERNIA, INGUINAL, ADULT (Right) - RIGHT INGUINAL HERNIA REPAIR WITH MESH - GEN AND TAP BLOCK as a surgical intervention.  The patient's history has been reviewed, patient examined, no change in status, stable for surgery.  I have reviewed the patient's chart and labs.  Questions were answered to the patient's satisfaction.   The risk of hernia repair include bleeding,  Infection,   Recurrence of the hernia,  Mesh use, chronic pain,  Organ injury,  Bowel injury,  Bladder injury,   nerve injury with numbness around the incision,  Death,  and worsening of preexisting  medical problems.  The alternatives to surgery have been discussed as well..  Long term expectations of both operative and non operative treatments have been discussed.   The patient agrees to proceed.   Tarl Cephas A Ocia Simek

## 2024-02-27 NOTE — Anesthesia Procedure Notes (Signed)
 Procedure Name: LMA Insertion Date/Time: 02/27/2024 8:33 AM  Performed by: Denton Niels CROME, CRNAPre-anesthesia Checklist: Patient identified, Emergency Drugs available, Suction available, Patient being monitored and Timeout performed Patient Re-evaluated:Patient Re-evaluated prior to induction Oxygen Delivery Method: Circle system utilized Preoxygenation: Pre-oxygenation with 100% oxygen Induction Type: IV induction Ventilation: Mask ventilation without difficulty LMA: LMA inserted LMA Size: 5.0 Number of attempts: 1 Placement Confirmation: positive ETCO2 Dental Injury: Teeth and Oropharynx as per pre-operative assessment

## 2024-02-27 NOTE — Progress Notes (Signed)
 Last received oxycodone  time written on dc paperwork. No ibuprofen /NSAIDS until after 6pm written on dc paperwork.

## 2024-02-27 NOTE — Transfer of Care (Signed)
 Immediate Anesthesia Transfer of Care Note  Patient: Timothy Newman  Procedure(s) Performed: RIGHT INGUINAL HERNIA REPAIR WITH MESH, ADULT (Right: Groin)  Patient Location: PACU  Anesthesia Type:GA combined with regional for post-op pain  Level of Consciousness: drowsy and patient cooperative  Airway & Oxygen Therapy: Patient Spontanous Breathing and Patient connected to face mask oxygen  Post-op Assessment: Report given to RN and Post -op Vital signs reviewed and stable  Post vital signs: Reviewed and stable  Last Vitals:  Vitals Value Taken Time  BP 104/69 02/27/24 10:25  Temp 36.6 C 02/27/24 10:25  Pulse 65 02/27/24 10:28  Resp 10 02/27/24 10:28  SpO2 100 % 02/27/24 10:28  Vitals shown include unfiled device data.  Last Pain:  Vitals:   02/27/24 1025  PainSc: Asleep      Patients Stated Pain Goal: 5 (02/27/24 0659)  Complications: No notable events documented.

## 2024-02-27 NOTE — Op Note (Signed)
 Preoperative diagnosis: Right inguinal hernia initial reducible  Postoperative diagnosis: Right inguinal hernia, pantaloon subtype, initial, reducible  Procedure: Repair of right inguinal hernia with Ovitex 6 x 10 cm 1S permanent mesh  Surgeon: Debby Shipper, MD  Anesthesia: General With transversus abdominis plane block and 0.25% Marcaine  with epinephrine   EBL: Minimal  Assistant: Puja Macias PA-C  Drains: None  Specimens: None  Indications for procedure: The patient is a 53 year old male with a right inguinal hernia.  He is getting larger causing discomfort and limiting his quality of life.  We discussed treatment options for hernias as well as surgery versus nonoperative management.  Given the fact that it is interfering with his day-to-day function, he wished to have it repaired.  Reviewed laparoscopic and open techniques and the use of mesh.The risk of hernia repair include bleeding,  Infection,   Recurrence of the hernia,  Mesh use, chronic pain,  Organ injury,  Bowel injury,  Bladder injury,   nerve injury with numbness around the incision,  Death,  and worsening of preexisting  medical problems.  The alternatives to surgery have been discussed as well..  Long term expectations of both operative and non operative treatments have been discussed.   The patient agrees to proceed.     Description of procedure: The patient was met in the holding area and questions were answered.  The right groin was marked as the correct site.  A block was administered by anesthesia in the holding area.  He was taken back to the operative room placed upon upon the operative room table.  After induction of general anesthesia, the right groin was prepped and draped in a sterile fashion timeout was performed.  He received appropriate preoperative antibiotics.  Local anesthetic was infiltrated into the skin of the right inguinal crease.  A 5 cm incision was made and dissection was carried down through Scarpa's  fascia.  Small vessels were controlled with cautery.  Once the aponeurosis of the external bleak was identified a small incision was made and the fibers were opened through the external ring exposing the cord structures.  The cord structures were encircled with 1/4 inch Penrose drain.  He had a large significant defect to the floor of his inguinal canal.  He also had a hernia sac with the cord structures and this was dissected off the cord structures and reduced back into the preperitoneal space.  He had significant tissue loss of the floor and this was quite loose.  A Lichtenstein repair was undertaken.  Mesh was used to cut into a keyhole configuration.  I did place a single stitch around a patulous internal ring of 0 Vicryl with care taken not to constrict the cord.  I did identify the ilioinguinal nerve.  This was going to be tethered in the operative field with reconstruction.  This was divided as it exited the muscle to prevent postoperative entrapment and neuropraxia type symptoms postoperatively.  The mesh was then secured to the shelving edge of the inguinal ligament, pubic tubercle and the internal oblique with interrupted #1 Novafil sutures.  The cord exited out the slit and the mesh was secured around the cord with #1 Novafil.  The cord was not constricted and there is ample room for the cord to exit without significant enlargement of the internal ring.  The mesh was examined and found to be hemostatic.  Any small bleeding vessels were controlled with cautery with good result.  There is ample room for the cord to exit  without constriction.  The mesh covered the defect quite nicely.  We then opted to close the external bleak with 2-0 Vicryl.  3-0 Vicryl was used to approximate Scarpa's fascia and 4 Monocryl was used to close the skin in a subcuticular fashion.  Dermabond was applied.  All counts were found to be correct.  The patient was then awoke extubated taken to recovery in satisfactory condition.

## 2024-02-27 NOTE — Anesthesia Postprocedure Evaluation (Signed)
 Anesthesia Post Note  Patient: Timothy Newman  Procedure(s) Performed: RIGHT INGUINAL HERNIA REPAIR WITH MESH, ADULT (Right: Groin)     Patient location during evaluation: PACU Anesthesia Type: General and Regional Level of consciousness: awake and alert Pain management: pain level controlled Vital Signs Assessment: post-procedure vital signs reviewed and stable Respiratory status: spontaneous breathing, nonlabored ventilation and respiratory function stable Cardiovascular status: blood pressure returned to baseline and stable Postop Assessment: no apparent nausea or vomiting Anesthetic complications: no   No notable events documented.  Last Vitals:  Vitals:   02/27/24 1100 02/27/24 1120  BP: 105/63 115/73  Pulse: 76 68  Resp: 14 16  Temp:  36.6 C  SpO2: 95% 98%    Last Pain:  Vitals:   02/27/24 1134  PainSc: 3                  Abdiaziz Klahn,W. EDMOND

## 2024-02-27 NOTE — Anesthesia Procedure Notes (Signed)
 Anesthesia Regional Block: TAP block   Pre-Anesthetic Checklist: , timeout performed,  Correct Patient, Correct Site, Correct Laterality,  Correct Procedure, Correct Position, site marked,  Risks and benefits discussed,  Pre-op evaluation,  At surgeon's request and post-op pain management  Laterality: Right  Prep: Maximum Sterile Barrier Precautions used, chloraprep       Needles:  Injection technique: Single-shot  Needle Type: Echogenic Stimulator Needle     Needle Length: 9cm  Needle Gauge: 21     Additional Needles:   Procedures:,,,, ultrasound used (permanent image in chart),,    Narrative:  Start time: 02/27/2024 7:55 AM End time: 02/27/2024 8:05 AM Injection made incrementally with aspirations every 5 mL.  Performed by: Personally  Anesthesiologist: Epifanio Fallow, MD

## 2024-02-28 ENCOUNTER — Encounter (HOSPITAL_BASED_OUTPATIENT_CLINIC_OR_DEPARTMENT_OTHER): Payer: Self-pay | Admitting: Surgery

## 2024-07-07 ENCOUNTER — Ambulatory Visit: Admitting: Physician Assistant

## 2024-07-07 ENCOUNTER — Encounter: Payer: Self-pay | Admitting: Physician Assistant

## 2024-07-07 VITALS — BP 112/70 | HR 67 | Temp 97.5°F | Resp 16 | Ht 72.0 in | Wt 218.0 lb

## 2024-07-07 DIAGNOSIS — Z8719 Personal history of other diseases of the digestive system: Secondary | ICD-10-CM | POA: Insufficient documentation

## 2024-07-07 DIAGNOSIS — E66811 Obesity, class 1: Secondary | ICD-10-CM | POA: Diagnosis not present

## 2024-07-07 DIAGNOSIS — N200 Calculus of kidney: Secondary | ICD-10-CM

## 2024-07-07 DIAGNOSIS — E7841 Elevated Lipoprotein(a): Secondary | ICD-10-CM

## 2024-07-07 DIAGNOSIS — Z9889 Other specified postprocedural states: Secondary | ICD-10-CM | POA: Diagnosis not present

## 2024-07-07 DIAGNOSIS — N400 Enlarged prostate without lower urinary tract symptoms: Secondary | ICD-10-CM | POA: Diagnosis not present

## 2024-07-07 DIAGNOSIS — E785 Hyperlipidemia, unspecified: Secondary | ICD-10-CM | POA: Diagnosis not present

## 2024-07-07 LAB — CBC WITH DIFFERENTIAL/PLATELET
Basophils Absolute: 0.1 x10E3/uL (ref 0.0–0.2)
Basos: 1 %
EOS (ABSOLUTE): 0.2 x10E3/uL (ref 0.0–0.4)
Eos: 4 %
Hematocrit: 49.5 % (ref 37.5–51.0)
Hemoglobin: 16.4 g/dL (ref 13.0–17.7)
Immature Grans (Abs): 0 x10E3/uL (ref 0.0–0.1)
Immature Granulocytes: 0 %
Lymphocytes Absolute: 2.1 x10E3/uL (ref 0.7–3.1)
Lymphs: 37 %
MCH: 32.3 pg (ref 26.6–33.0)
MCHC: 33.1 g/dL (ref 31.5–35.7)
MCV: 97 fL (ref 79–97)
Monocytes Absolute: 0.7 x10E3/uL (ref 0.1–0.9)
Monocytes: 12 %
Neutrophils Absolute: 2.6 x10E3/uL (ref 1.4–7.0)
Neutrophils: 46 %
Platelets: 261 x10E3/uL (ref 150–450)
RBC: 5.08 x10E6/uL (ref 4.14–5.80)
RDW: 11.8 % (ref 11.6–15.4)
WBC: 5.7 x10E3/uL (ref 3.4–10.8)

## 2024-07-07 LAB — COMPREHENSIVE METABOLIC PANEL WITH GFR
ALT: 18 IU/L (ref 0–44)
AST: 15 IU/L (ref 0–40)
Albumin: 4.4 g/dL (ref 3.8–4.9)
Alkaline Phosphatase: 56 IU/L (ref 47–123)
BUN/Creatinine Ratio: 21 — ABNORMAL HIGH (ref 9–20)
BUN: 24 mg/dL (ref 6–24)
Bilirubin Total: 0.7 mg/dL (ref 0.0–1.2)
CO2: 27 mmol/L (ref 20–29)
Calcium: 9.8 mg/dL (ref 8.7–10.2)
Chloride: 103 mmol/L (ref 96–106)
Creatinine, Ser: 1.16 mg/dL (ref 0.76–1.27)
Globulin, Total: 2.2 g/dL (ref 1.5–4.5)
Glucose: 98 mg/dL (ref 70–99)
Potassium: 5 mmol/L (ref 3.5–5.2)
Sodium: 140 mmol/L (ref 134–144)
Total Protein: 6.6 g/dL (ref 6.0–8.5)
eGFR: 75 mL/min/1.73

## 2024-07-07 LAB — TSH: TSH: 1.28 u[IU]/mL (ref 0.450–4.500)

## 2024-07-07 LAB — LIPID PANEL
Chol/HDL Ratio: 3.5 ratio (ref 0.0–5.0)
Cholesterol, Total: 182 mg/dL (ref 100–199)
HDL: 52 mg/dL
LDL Chol Calc (NIH): 119 mg/dL — ABNORMAL HIGH (ref 0–99)
Triglycerides: 57 mg/dL (ref 0–149)
VLDL Cholesterol Cal: 11 mg/dL (ref 5–40)

## 2024-07-07 LAB — PSA: Prostate Specific Ag, Serum: 0.7 ng/mL (ref 0.0–4.0)

## 2024-07-07 LAB — HEMOGLOBIN A1C
Est. average glucose Bld gHb Est-mCnc: 100 mg/dL
Hgb A1c MFr Bld: 5.1 % (ref 4.8–5.6)

## 2024-07-07 NOTE — Assessment & Plan Note (Signed)
 Nephrolithiasis No recent painful episodes, indicating effective management with hydration. - Continue adequate hydration.

## 2024-07-07 NOTE — Assessment & Plan Note (Addendum)
 Benign prostatic hyperplasia No changes in management discussed. Denies any new or worsening symptoms Continue to monitor Orders:   PSA

## 2024-07-07 NOTE — Progress Notes (Signed)
 "  Subjective:  Patient ID: Timothy Newman, male    DOB: 06-Apr-1971  Age: 54 y.o. MRN: [id number]  Chief Complaint  Patient presents with   Medical Management of Chronic Issues    HPI:  Discussed the use of AI scribe software for clinical note transcription with the patient, who gave verbal consent to proceed.  History of Present Illness Timothy Newman is a 54 year old male who presents for a chronic follow-up visit.  He experiences ongoing tenderness at the site of a hernia repair surgery performed in September. The area remains tender, but he reports being able to perform daily activities such as lifting and moving. He avoids lifting heavy objects to prevent recurrence. The hernia was initially caused by lifting, and he was unaware of the exact moment it occurred.  He has a history of kidney stones, with the last occurrence being sometime last year. The first two stones were painful to pass, but the most recent one was not. He hydrates well, drinking a minimum of 64 ounces of water  daily, and sometimes up to 100 ounces, to prevent further stone formation. He wakes up twice at night to urinate, which he prefers over the risk of developing kidney stones.  He has a history of heart arrhythmias, first noticed in his thirties after a trip to the beach. He describes the sensation as his heart 'skipping a beat'. No recent symptoms such as shortness of breath, chest pain, or weakness.  He reports a recent neck strain from carrying a poker table upstairs, resulting in muscle pain.  He does not take any regular medications but uses ibuprofen  as needed and takes vitamin D , B12, and Airborne supplements. He is mindful of hydration to avoid kidney stones due to the high vitamin C content in Airborne.         10/03/2023    3:09 PM 11/01/2021   10:31 AM 10/28/2020    3:24 PM 10/27/2019    3:18 PM 05/14/2018    3:06 PM  Depression screen PHQ 2/9  Decreased Interest 0 0 0 0 0  Down, Depressed, Hopeless 0  0 0 0 0  PHQ - 2 Score 0 0 0 0 0        10/03/2023    3:08 PM  Fall Risk   Falls in the past year? 0  Number falls in past yr: 0  Injury with Fall? 0   Risk for fall due to : No Fall Risks  Follow up Falls evaluation completed     Data saved with a previous flowsheet row definition    Patient Care Team: Milon Cleaves, [place] as PCP - General (Physician Assistant)   Review of Systems  Constitutional:  Negative for chills, fatigue, fever and unexpected weight change.  HENT:  Negative for congestion, ear pain, sinus pain and sore throat.   Respiratory:  Negative for cough and shortness of breath.   Cardiovascular:  Negative for chest pain and palpitations.  Gastrointestinal:  Negative for abdominal pain, blood in stool, constipation, diarrhea, nausea and vomiting.  Endocrine: Negative for polydipsia.  Genitourinary:  Negative for dysuria.  Musculoskeletal:  Positive for neck pain. Negative for back pain.  Skin:  Negative for rash.  Neurological:  Negative for headaches.    Medications Ordered Prior to Encounter[1] Past Medical History:  Diagnosis Date   Arthritis    Family history of abdominal aortic aneurysm (AAA) 05/16/2019   History of kidney stones    Inflamed sebaceous cyst s/p  excision Nov 2014 05/12/2013   MRSA cellulitis ?2004   I&D in ED.  IV Vanco w PICC   Primary osteoarthritis of left hip 11/03/2020   Umbilical hernia - 5mm 05/12/2013   Past Surgical History:  Procedure Laterality Date   INCISION AND DRAINAGE ABSCESS / HEMATOMA OF BURSA / KNEE / THIGH Right ?2004   ?Dr MICAEL Sink    INGUINAL HERNIA REPAIR Right 02/27/2024   Procedure: RIGHT INGUINAL HERNIA REPAIR WITH MESH, ADULT;  Surgeon: Vanderbilt Ned, MD;  Location: [facility];  Service: General;  Laterality: Right;  RIGHT INGUINAL HERNIA REPAIR WITH MESH - GEN AND TAP BLOCK   TOTAL HIP ARTHROPLASTY Left 09/23/2021   Procedure: Left TOTAL HIP ARTHROPLASTY ANTERIOR APPROACH;  Surgeon: Vernetta Lonni GRADE, MD;  Location: [facility];  Service: Orthopedics;  Laterality: Left;   WISDOM TOOTH EXTRACTION  1985    Family History  Problem Relation Age of Onset   Asthma Mother    Fibromyalgia Mother    Hyperlipidemia Mother    Melanoma Father        with brain mets   AAA (abdominal aortic aneurysm) Maternal Grandmother    Lung cancer Maternal Grandfather        worked in dance movement psychotherapist   Cancer Paternal Grandmother    Heart failure Paternal Grandfather 25   Social History   Socioeconomic History   Marital status: Married    Spouse name: Not on file   Number of children: 2   Years of education: Not on file   Highest education level: Not on file  Occupational History   Not on file  Tobacco Use   Smoking status: Former    Current packs/day: 0.00    Average packs/day: 1 pack/day for 3.0 years (3.0 ttl pk-yrs)    Types: Cigarettes    Start date: 05/14/1989    Quit date: 05/14/1992    Years since quitting: 32.1   Smokeless tobacco: Former    Types: Snuff    Quit date: 05/15/2015  Vaping Use   Vaping status: Never Used  Substance and Sexual Activity   Alcohol use: No   Drug use: No   Sexual activity: Yes    Partners: Female    Birth control/protection: Surgical    Comment: wife s/p hysterectomy   Other Topics Concern   Not on file  Social History Narrative   Not on file   Social Drivers of Health   Tobacco Use: Medium Risk (03/27/2024)   Received from [facility]   Patient History    Smoking Tobacco Use: Former    Smokeless Tobacco Use: Never    Passive Exposure: Not on Actuary Strain: Not on file  Food Insecurity: Not on file  Transportation Needs: Not on file  Physical Activity: Not on file  Stress: Not on file  Social Connections: Not on file  Depression (PHQ2-9): Low Risk (10/03/2023)   Depression (PHQ2-9)    PHQ-2 Score: 0  Alcohol Screen: Not on file  Housing: Unknown (02/04/2024)   Received from [facility]   Epic    Unable to Pay for Housing in the Last Year: Not on file    Number of Times Moved in the Last Year: Not on file    At any time in the past 12 months, were you homeless or living in a shelter (including now)?: No  Utilities: Not on file  Health Literacy: Not on file  Objective:  There were no vitals taken for this visit.     02/27/2024   11:20 AM 02/27/2024   11:00 AM 02/27/2024   10:45 AM  BP/Weight  Systolic BP 115 105 103  Diastolic BP 73 63 68    Physical Exam Vitals reviewed.  Constitutional:      Appearance: Normal appearance.  Cardiovascular:     Rate and Rhythm: Normal rate. Rhythm irregular.     Heart sounds: Normal heart sounds.  Pulmonary:     Effort: Pulmonary effort is normal.     Breath sounds: Normal breath sounds.  Abdominal:     General: Bowel sounds are normal.     Palpations: Abdomen is soft.     Tenderness: There is no abdominal tenderness.  Neurological:     Mental Status: He is alert and oriented to person, place, and time.  Psychiatric:        Mood and Affect: Mood normal.        Behavior: Behavior normal.         Lab Results  Component Value Date   WBC 6.6 01/03/2024   HGB 16.8 01/03/2024   HCT 49.5 01/03/2024   PLT 237 01/03/2024   GLUCOSE 86 01/03/2024   CHOL 185 01/03/2024   TRIG 73 01/03/2024   HDL 46 01/03/2024   LDLCALC 125 (H) 01/03/2024   ALT 18 01/03/2024   AST 15 01/03/2024   NA 140 01/03/2024   K 4.7 01/03/2024   CL 102 01/03/2024   CREATININE 1.08 01/03/2024   BUN 30 (H) 01/03/2024   CO2 22 01/03/2024   TSH 0.910 10/03/2023   PSA 0.54 11/02/2022   HGBA1C 5.2 10/03/2023    Results for orders placed or performed in visit on 01/03/24  Comprehensive metabolic panel with GFR   Collection Time: 01/03/24  8:30 AM  Result Value Ref Range   Glucose 86 70 - 99 mg/dL   BUN 30 (H) 6 - 24 mg/dL   Creatinine, Ser 8.91 0.76 - 1.27 mg/dL   eGFR 82 >40 fO/fpw/8.26   BUN/Creatinine Ratio 28 (H) 9 - 20    Sodium 140 134 - 144 mmol/L   Potassium 4.7 3.5 - 5.2 mmol/L   Chloride 102 96 - 106 mmol/L   CO2 22 20 - 29 mmol/L   Calcium 9.7 8.7 - 10.2 mg/dL   Total Protein 6.9 6.0 - 8.5 g/dL   Albumin 4.5 3.8 - 4.9 g/dL   Globulin, Total 2.4 1.5 - 4.5 g/dL   Bilirubin Total 0.9 0.0 - 1.2 mg/dL   Alkaline Phosphatase 55 44 - 121 IU/L   AST 15 0 - 40 IU/L   ALT 18 0 - 44 IU/L  CBC with Differential/Platelet   Collection Time: 01/03/24  8:30 AM  Result Value Ref Range   WBC 6.6 3.4 - 10.8 x10E3/uL   RBC 5.01 4.14 - 5.80 x10E6/uL   Hemoglobin 16.8 13.0 - 17.7 g/dL   Hematocrit 50.4 62.4 - 51.0 %   MCV 99 (H) 79 - 97 fL   MCH 33.5 (H) 26.6 - 33.0 pg   MCHC 33.9 31.5 - 35.7 g/dL   RDW 88.0 88.3 - 84.5 %   Platelets 237 150 - 450 x10E3/uL   Neutrophils 50 Not Estab. %   Lymphs 34 Not Estab. %   Monocytes 12 Not Estab. %   Eos 3 Not Estab. %   Basos 1 Not Estab. %   Neutrophils Absolute 3.3 1.4 - 7.0 x10E3/uL  Lymphocytes Absolute 2.2 0.7 - 3.1 x10E3/uL   Monocytes Absolute 0.8 0.1 - 0.9 x10E3/uL   EOS (ABSOLUTE) 0.2 0.0 - 0.4 x10E3/uL   Basophils Absolute 0.1 0.0 - 0.2 x10E3/uL   Immature Granulocytes 0 Not Estab. %   Immature Grans (Abs) 0.0 0.0 - 0.1 x10E3/uL  Lipid panel   Collection Time: 01/03/24  8:30 AM  Result Value Ref Range   Cholesterol, Total 185 100 - 199 mg/dL   Triglycerides 73 0 - 149 mg/dL   HDL 46 >60 mg/dL   VLDL Cholesterol Cal 14 5 - 40 mg/dL   LDL Chol Calc (NIH) 874 (H) 0 - 99 mg/dL   Chol/HDL Ratio 4.0 0.0 - 5.0 ratio  .  Assessment & Plan:   Assessment & Plan Elevated lipoprotein(a) Hyperlipidemia Cholesterol levels not high enough for medication. - Ordered blood draw to screen cholesterol levels. - Continue to monitor diet and exercise Orders:   Lipid panel  Obesity (BMI 30.0-34.9) Obesity Weight loss efforts ongoing with lifestyle modifications. Continue to monitor diet and exercise Labs drawn today Will adjust treatment based on  results Orders:   CBC with Differential/Platelet   Comprehensive metabolic panel with GFR  Enlarged prostate Benign prostatic hyperplasia No changes in management discussed. Denies any new or worsening symptoms Continue to monitor Orders:   PSA  Hyperlipidemia, unspecified hyperlipidemia type Controlled Continue to monitor diet and exercise Labs drawn today Will adjust treatment based on results Orders:   TSH   Hemoglobin A1c  History of inguinal hernia repair Postoperative care following hernia repair Postoperative tenderness persists due to defect extent and muscle proximity. No acute complications. - Advised to avoid heavy lifting and pace activities.    Kidney stones Nephrolithiasis No recent painful episodes, indicating effective management with hydration. - Continue adequate hydration.    General Health Maintenance Routine health maintenance discussed, emphasizing hydration and vitamin intake. - Ensure adequate hydration to prevent kidney stones. - Continue vitamin regimen with vitamin D , B12, and Airborne.  There is no height or weight on file to calculate BMI.   No orders of the defined types were placed in this encounter.  No orders of the defined types were placed in this encounter.    Follow-up: No follow-ups on file.  An After Visit Summary was printed and given to the patient.  Nola Angles, [place] [facility] [phone]     [1]  Current Outpatient Medications on File Prior to Visit  Medication Sig Dispense Refill   ibuprofen  (ADVIL ) 800 MG tablet Take 1 tablet (800 mg total) by mouth every 8 (eight) hours as needed. 30 tablet 0   methocarbamol  (ROBAXIN ) 500 MG tablet Take 1 tablet (500 mg total) by mouth 4 (four) times daily. 10 tablet 0   Multiple Vitamins-Minerals (AIRBORNE PO) Take by mouth.     oxyCODONE  (OXY IR/ROXICODONE ) 5 MG immediate release tablet Take 1 tablet (5 mg total) by mouth every 6 (six) hours as needed for severe  pain (pain score 7-10). 15 tablet 0   VITAMIN D  PO Take by mouth.     No current facility-administered medications on file prior to visit.   "

## 2024-07-07 NOTE — Assessment & Plan Note (Addendum)
 Controlled Continue to monitor diet and exercise Labs drawn today Will adjust treatment based on results Orders:   TSH   Hemoglobin A1c

## 2024-07-07 NOTE — Assessment & Plan Note (Addendum)
 Hyperlipidemia Cholesterol levels not high enough for medication. - Ordered blood draw to screen cholesterol levels. - Continue to monitor diet and exercise Orders:   Lipid panel

## 2024-07-07 NOTE — Assessment & Plan Note (Signed)
 Postoperative care following hernia repair Postoperative tenderness persists due to defect extent and muscle proximity. No acute complications. - Advised to avoid heavy lifting and pace activities.

## 2024-07-07 NOTE — Assessment & Plan Note (Addendum)
 Obesity Weight loss efforts ongoing with lifestyle modifications. Continue to monitor diet and exercise Labs drawn today Will adjust treatment based on results Orders:   CBC with Differential/Platelet   Comprehensive metabolic panel with GFR

## 2024-07-08 ENCOUNTER — Ambulatory Visit: Payer: Self-pay | Admitting: Physician Assistant

## 2025-01-05 ENCOUNTER — Encounter: Admitting: Physician Assistant
